# Patient Record
Sex: Female | Born: 1990
Health system: Southern US, Community
[De-identification: ages and names within clinical notes are randomized; demographics above are authoritative.]

## PROBLEM LIST (undated history)

## (undated) DIAGNOSIS — E282 Polycystic ovarian syndrome: Secondary | ICD-10-CM

## (undated) DIAGNOSIS — J45909 Unspecified asthma, uncomplicated: Secondary | ICD-10-CM

## (undated) DIAGNOSIS — N83209 Unspecified ovarian cyst, unspecified side: Secondary | ICD-10-CM

## (undated) DIAGNOSIS — N946 Dysmenorrhea, unspecified: Secondary | ICD-10-CM

## (undated) DIAGNOSIS — D696 Thrombocytopenia, unspecified: Secondary | ICD-10-CM

## (undated) HISTORY — DX: Thrombocytopenia, unspecified: D69.6

## (undated) HISTORY — DX: Polycystic ovarian syndrome: E28.2

## (undated) HISTORY — DX: Unspecified asthma, uncomplicated: J45.909

## (undated) HISTORY — DX: Dysmenorrhea, unspecified: N94.6

## (undated) HISTORY — PX: NO PAST SURGERIES: SHX2092

---

## 2012-04-01 ENCOUNTER — Encounter: Payer: Self-pay | Admitting: Family Medicine

## 2012-04-01 ENCOUNTER — Ambulatory Visit (INDEPENDENT_AMBULATORY_CARE_PROVIDER_SITE_OTHER): Payer: Medicaid Other | Admitting: Family Medicine

## 2012-04-01 VITALS — Temp 97.7°F | Ht 65.5 in | Wt 156.3 lb

## 2012-04-01 DIAGNOSIS — Z0000 Encounter for general adult medical examination without abnormal findings: Secondary | ICD-10-CM

## 2012-04-01 DIAGNOSIS — J45990 Exercise induced bronchospasm: Secondary | ICD-10-CM | POA: Insufficient documentation

## 2012-04-01 DIAGNOSIS — N6459 Other signs and symptoms in breast: Secondary | ICD-10-CM | POA: Insufficient documentation

## 2012-04-01 NOTE — Assessment & Plan Note (Signed)
Normal exam.  Discussed healthy lifestyle and to consider multivitamin.  Will bring back physical form and immunization records when she returns for two step tb test per school requirements.

## 2012-04-01 NOTE — Progress Notes (Signed)
  Subjective:    Patient ID: Kaitlyn Espinoza, female    DOB: Jan 02, 1991, 21 y.o.   MRN: [id number]  HPI Here to establish care.  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 Did not bring it today.  Requests two step TB testing per school requirements.  Has had TB test in the past,negative.  Breast cysts:  Is concerned that she may have felt cysts in one of her breasts in the past.  Not sure which breast.  Seems to come and go with menses.  Her mother and grandmother have breast cysts.    Review of Systems Patient Information Form: Screening and ROS  AUDIT-C Score: 2 Do you feel safe in relationships? yes PHQ-2:negative  Review of Symptoms  General:  Negative for nexplained weight loss, fever Skin: Negative for new or changing mole, sore that won't heal HEENT: Negative for trouble hearing, trouble seeing, ringing in ears, mouth sores, hoarseness, change in voice, dysphagia. CV:  Negative for chest pain, dyspnea, edema, palpitations Resp: Negative for cough, dyspnea, hemoptysis GI: Negative for nausea, vomiting, diarrhea, constipation, abdominal pain, melena, hematochezia. GU: Negative for dysuria, incontinence, urinary hesitance, hematuria, vaginal or penile discharge, polyuria, sexual difficulty, lumps in testicle or breasts MSK: Negative for muscle cramps or aches, joint pain or swelling Neuro: Negative for headaches, weakness, numbness, dizziness, passing out/fainting Psych: Negative for depression, anxiety, memory problems  Positive for stress, well managed with prayer.    Objective:   Physical Exam GEN: Alert & Oriented, No acute distress HEENT: [place]/AT. EOMI, PERRLA, no conjunctival injection or scleral icterus.  Bilateral tympanic membranes intact without erythema or effusion.  .  Nares without edema or rhinorrhea.  Oropharynx is without erythema or exudates.  No anterior or posterior cervical lymphadenopathy. Breasts: no masses, lumps, skin changes, or nipple changes. CV:   Regular Rate & Rhythm, no murmur Respiratory:  Normal work of breathing, CTAB Abd:  + BS, soft, no tenderness to palpation Ext: no pre-tibial edema        Assessment & Plan:

## 2012-04-01 NOTE — Patient Instructions (Addendum)
TB test- must get shot and then come back to have it read in 48-72 hours.  You must repeat TB test in 2 weeks.  Consider taking a daily multivitamin  Consider adding daily exercise to help with your health and with stress  Please schedule a nurse visit for your TB tests and for your Depo.  Bring back your physical form and I will try to fill it out with the exam we did today.

## 2012-04-01 NOTE — Assessment & Plan Note (Signed)
Reassured patient no abnormalities seen today.  Advised to return if notices changes.

## 2012-04-08 ENCOUNTER — Telehealth: Payer: Self-pay | Admitting: *Deleted

## 2012-04-08 ENCOUNTER — Ambulatory Visit (INDEPENDENT_AMBULATORY_CARE_PROVIDER_SITE_OTHER): Payer: Medicaid Other | Admitting: *Deleted

## 2012-04-08 DIAGNOSIS — Z111 Encounter for screening for respiratory tuberculosis: Secondary | ICD-10-CM

## 2012-04-08 DIAGNOSIS — Z00129 Encounter for routine child health examination without abnormal findings: Secondary | ICD-10-CM

## 2012-04-08 DIAGNOSIS — Z0110 Encounter for examination of ears and hearing without abnormal findings: Secondary | ICD-10-CM

## 2012-04-08 DIAGNOSIS — Z0100 Encounter for examination of eyes and vision without abnormal findings: Secondary | ICD-10-CM

## 2012-04-08 DIAGNOSIS — Z020 Encounter for examination for admission to educational institution: Secondary | ICD-10-CM

## 2012-04-08 NOTE — Progress Notes (Signed)
Patient brings in from for college for Dr. Earnest Bailey to fill out. Vision and hearing needed.  Performed while patient is in office.

## 2012-04-08 NOTE — Telephone Encounter (Signed)
Form for college placed in MD box for completion.

## 2012-04-09 DIAGNOSIS — Z020 Encounter for examination for admission to educational institution: Secondary | ICD-10-CM | POA: Insufficient documentation

## 2012-04-09 NOTE — Telephone Encounter (Signed)
Form filled out, roders placed for required tests (U/A, HGB)

## 2012-04-10 ENCOUNTER — Ambulatory Visit: Payer: Medicaid Other | Admitting: *Deleted

## 2012-04-10 ENCOUNTER — Other Ambulatory Visit (INDEPENDENT_AMBULATORY_CARE_PROVIDER_SITE_OTHER): Payer: Medicaid Other

## 2012-04-10 VITALS — BP 102/72

## 2012-04-10 DIAGNOSIS — IMO0001 Reserved for inherently not codable concepts without codable children: Secondary | ICD-10-CM

## 2012-04-10 DIAGNOSIS — Z020 Encounter for examination for admission to educational institution: Secondary | ICD-10-CM

## 2012-04-10 DIAGNOSIS — Z0289 Encounter for other administrative examinations: Secondary | ICD-10-CM

## 2012-04-10 DIAGNOSIS — Z0000 Encounter for general adult medical examination without abnormal findings: Secondary | ICD-10-CM

## 2012-04-10 LAB — POCT URINALYSIS DIPSTICK
Blood, UA: NEGATIVE
Glucose, UA: NEGATIVE
Spec Grav, UA: 1.02
Urobilinogen, UA: 2

## 2012-04-10 LAB — TB SKIN TEST
Induration: 0 mm
TB Skin Test: NEGATIVE

## 2012-04-10 NOTE — Progress Notes (Signed)
Urinalysis and Hgb done.Kaitlyn Espinoza, Kaitlyn Espinoza

## 2012-04-10 NOTE — Progress Notes (Signed)
Patient in office for completion of college form.  Labs obtained for urinalysis and HGB. Entered on form along with BP reading today.

## 2012-04-22 ENCOUNTER — Encounter: Payer: Self-pay | Admitting: Family Medicine

## 2012-04-22 ENCOUNTER — Ambulatory Visit (INDEPENDENT_AMBULATORY_CARE_PROVIDER_SITE_OTHER): Payer: Medicaid Other | Admitting: Family Medicine

## 2012-04-22 ENCOUNTER — Encounter: Payer: Self-pay | Admitting: *Deleted

## 2012-04-22 VITALS — BP 112/81 | HR 106 | Temp 98.6°F | Ht 65.5 in | Wt 155.0 lb

## 2012-04-22 DIAGNOSIS — R0600 Dyspnea, unspecified: Secondary | ICD-10-CM | POA: Insufficient documentation

## 2012-04-22 DIAGNOSIS — Z111 Encounter for screening for respiratory tuberculosis: Secondary | ICD-10-CM

## 2012-04-22 DIAGNOSIS — R0989 Other specified symptoms and signs involving the circulatory and respiratory systems: Secondary | ICD-10-CM

## 2012-04-22 MED ORDER — ALBUTEROL SULFATE HFA 108 (90 BASE) MCG/ACT IN AERS: 2.0000 | INHALATION_SPRAY | Freq: Four times a day (QID) | RESPIRATORY_TRACT | Status: DC | PRN

## 2012-04-22 MED ORDER — TUBERCULIN PPD 5 UNIT/0.1ML ID SOLN: 5.0000 [IU] | Freq: Once | INTRADERMAL | Status: DC

## 2012-04-22 NOTE — Assessment & Plan Note (Addendum)
?   History of exercise induced asthma as a teen.  Unclear from history if this was asthma.  No red flags today for other causes of dyspnea.  Will tx empirically with prn albuterol, discussed red flags to return for further evaluation

## 2012-04-22 NOTE — Patient Instructions (Addendum)
If you find you are having to use regularly (weekly) or you get short of breath at times not associated with heat, please let me know so we can explore this further.

## 2012-04-22 NOTE — Progress Notes (Signed)
  Subjective:    Patient ID: Kaitlyn Espinoza, female    DOB: December 23, 1990, 21 y.o.   MRN: [id number]  HPI On hot and humid days, feel fatigued and heavy-chested when breathing.  Had exercise induced asthma as a kid.  In high school, had one ER visit  For possible asthma.  No trouble with exercise in cool environments. This occurs infrequently.  I have reviewed patient's  PMH, FH, and Social history and Medications as related to this visit. Brother has asthma.  Review of Systems no fever,chills, nausea, dyspnea on exertion, cough.     Objective:   Physical Exam vitals reviewed.  Not tachycardic GEN: Alert & Oriented, No acute distress CV:  Regular Rate & Rhythm, no murmur Respiratory:  Normal work of breathing, CTAB Abd:  + BS, soft, no tenderness to palpation Ext: no pre-tibial edema         Assessment & Plan:

## 2012-04-25 ENCOUNTER — Ambulatory Visit (INDEPENDENT_AMBULATORY_CARE_PROVIDER_SITE_OTHER): Payer: Medicaid Other | Admitting: *Deleted

## 2012-04-25 DIAGNOSIS — Z111 Encounter for screening for respiratory tuberculosis: Secondary | ICD-10-CM

## 2012-04-25 DIAGNOSIS — IMO0001 Reserved for inherently not codable concepts without codable children: Secondary | ICD-10-CM

## 2012-04-25 NOTE — Progress Notes (Signed)
PPD negative 0 mm induration

## 2012-06-27 ENCOUNTER — Encounter: Payer: Self-pay | Admitting: Family Medicine

## 2012-06-27 ENCOUNTER — Ambulatory Visit (INDEPENDENT_AMBULATORY_CARE_PROVIDER_SITE_OTHER): Payer: Medicaid Other | Admitting: Family Medicine

## 2012-06-27 VITALS — BP 107/68 | HR 81 | Temp 98.2°F | Ht 65.5 in | Wt 162.0 lb

## 2012-06-27 DIAGNOSIS — M549 Dorsalgia, unspecified: Secondary | ICD-10-CM

## 2012-06-27 MED ORDER — CYCLOBENZAPRINE HCL 5 MG PO TABS: 5.0000 mg | ORAL_TABLET | Freq: Every evening | ORAL | Status: DC | PRN

## 2012-06-27 MED ORDER — NAPROXEN 500 MG PO TABS: 500.0000 mg | ORAL_TABLET | Freq: Two times a day (BID) | ORAL | Status: DC

## 2012-06-27 NOTE — Patient Instructions (Signed)
Back Exercises These exercises may help you when beginning to rehabilitate your injury. Your symptoms may resolve with or without further involvement from your physician, physical therapist or athletic trainer. While completing these exercises, remember:   Restoring tissue flexibility helps normal motion to return to the joints. This allows healthier, less painful movement and activity.   An effective stretch should be held for at least 30 seconds.   A stretch should never be painful. You should only feel a gentle lengthening or release in the stretched tissue.  STRETCH - Extension, Prone on Elbows   Lie on your stomach on the floor, a bed will be too soft. Place your palms about shoulder width apart and at the height of your head.   Place your elbows under your shoulders. If this is too painful, stack pillows under your chest.   Allow your body to relax so that your hips drop lower and make contact more completely with the floor.   Hold this position for ____30______ seconds.   Slowly return to lying flat on the floor.  Repeat ____3______ times. Complete this exercise _____2_____ times per day.  RANGE OF MOTION - Extension, Prone Press Ups   Lie on your stomach on the floor, a bed will be too soft. Place your palms about shoulder width apart and at the height of your head.   Keeping your back as relaxed as possible, slowly straighten your elbows while keeping your hips on the floor. You may adjust the placement of your hands to maximize your comfort. As you gain motion, your hands will come more underneath your shoulders.   Hold this position __________ seconds.   Slowly return to lying flat on the floor.  Repeat __________ times. Complete this exercise __________ times per day.  RANGE OF MOTION- Quadruped, Neutral Spine   Assume a hands and knees position on a firm surface. Keep your hands under your shoulders and your knees under your hips. You may place padding under your knees  for comfort.   Drop your head and point your tail bone toward the ground below you. This will round out your low back like an angry cat. Hold this position for __________ seconds.   Slowly lift your head and release your tail bone so that your back sags into a large arch, like an old horse.   Hold this position for __________ seconds.   Repeat this until you feel limber in your low back.   Now, find your "sweet spot." This will be the most comfortable position somewhere between the two previous positions. This is your neutral spine. Once you have found this position, tense your stomach muscles to support your low back.   Hold this position for __________ seconds.  Repeat __________ times. Complete this exercise __________ times per day.  STRETCH - Flexion, Single Knee to Chest   Lie on a firm bed or floor with both legs extended in front of you.   Keeping one leg in contact with the floor, bring your opposite knee to your chest. Hold your leg in place by either grabbing behind your thigh or at your knee.   Pull until you feel a gentle stretch in your low back. Hold __________ seconds.   Slowly release your grasp and repeat the exercise with the opposite side.  Repeat __________ times. Complete this exercise __________ times per day.  STRETCH - Hamstrings, Standing  Stand or sit and extend your right / left leg, placing your foot on a chair  or foot stool   Keeping a slight arch in your low back and your hips straight forward.   Lead with your chest and lean forward at the waist until you feel a gentle stretch in the back of your right / left knee or thigh. (When done correctly, this exercise requires leaning only a small distance.)   Hold this position for __________ seconds.  Repeat __________ times. Complete this stretch __________ times per day. STRENGTHENING - Deep Abdominals, Pelvic Tilt   Lie on a firm bed or floor. Keeping your legs in front of you, bend your knees so they  are both pointed toward the ceiling and your feet are flat on the floor.   Tense your lower abdominal muscles to press your low back into the floor. This motion will rotate your pelvis so that your tail bone is scooping upwards rather than pointing at your feet or into the floor.   With a gentle tension and even breathing, hold this position for __________ seconds.  Repeat __________ times. Complete this exercise __________ times per day.  STRENGTHENING - Abdominals, Crunches   Lie on a firm bed or floor. Keeping your legs in front of you, bend your knees so they are both pointed toward the ceiling and your feet are flat on the floor. Cross your arms over your chest.   Slightly tip your chin down without bending your neck.   Tense your abdominals and slowly lift your trunk high enough to just clear your shoulder blades. Lifting higher can put excessive stress on the low back and does not further strengthen your abdominal muscles.   Control your return to the starting position.  Repeat __________ times. Complete this exercise __________ times per day.  STRENGTHENING - Quadruped, Opposite UE/LE Lift   Assume a hands and knees position on a firm surface. Keep your hands under your shoulders and your knees under your hips. You may place padding under your knees for comfort.   Find your neutral spine and gently tense your abdominal muscles so that you can maintain this position. Your shoulders and hips should form a rectangle that is parallel with the floor and is not twisted.   Keeping your trunk steady, lift your right hand no higher than your shoulder and then your left leg no higher than your hip. Make sure you are not holding your breath. Hold this position __________ seconds.   Continuing to keep your abdominal muscles tense and your back steady, slowly return to your starting position. Repeat with the opposite arm and leg.  Repeat __________ times. Complete this exercise __________ times  per day. Document Released: 10/05/2005 Document Revised: 09/06/2011 Document Reviewed: 12/30/2008 [facility] Patient Information 2012 [place], [place].

## 2012-06-27 NOTE — Assessment & Plan Note (Signed)
No acute injury, no red flags on history or exam.  Discussed course of back pain.  Rx for naproxen, schedule x 1 week, then prn, and Rx flexeril qhs prn.  Gave hand out for home exercise program.  F/U if not improving in 4 weeks.

## 2012-06-27 NOTE — Progress Notes (Signed)
  Subjective:    Patient ID: Kaitlyn Espinoza, female    DOB: Apr 27, 1991, 21 y.o.   MRN: [id number]  HPI  Patient presents with low and mid back pain x 1 week. She had no injury, and has not changed her activity.  The pain is in the sides in the mid back and low back.  It has been keeping her up at night.  She says she has been taking tylenol that helps, but she was worried about taking too much.  She has not had pain shooting down her legs.  She denies any other symptoms- no fevers/chills/nausea/vomiting/dsyuria.   Review of Systems See HPI    Objective:   Physical Exam BP 107/68  Pulse 81  Temp 98.2 F (36.8 C) (Oral)  Ht 5' 5.5" (1.664 m)  Wt 162 lb (73.483 kg)  BMI 26.55 kg/m2 General appearance: alert, cooperative and no distress Back: symmetric, no curvature. ROM normal. No CVA tenderness. Mild TTP in paraspinal muscles in thoracic and lumbar spine.  LE strength, sensation, reflexes are normal.  Straight leg test negative.        Assessment & Plan:

## 2012-07-09 ENCOUNTER — Ambulatory Visit (INDEPENDENT_AMBULATORY_CARE_PROVIDER_SITE_OTHER): Payer: Self-pay | Admitting: Family Medicine

## 2012-07-09 ENCOUNTER — Encounter: Payer: Self-pay | Admitting: Family Medicine

## 2012-07-09 VITALS — BP 107/72 | HR 85 | Temp 98.3°F | Ht 65.5 in | Wt 164.0 lb

## 2012-07-09 DIAGNOSIS — J069 Acute upper respiratory infection, unspecified: Secondary | ICD-10-CM

## 2012-07-09 MED ORDER — FLUTICASONE PROPIONATE 50 MCG/ACT NA SUSP: 2.0000 | Freq: Every day | NASAL | Status: DC

## 2012-07-09 NOTE — Patient Instructions (Signed)

## 2012-07-09 NOTE — Progress Notes (Signed)
  Subjective:    Patient ID: Kaitlyn Espinoza, female    DOB: 1991/05/01, 21 y.o.   MRN: [id number]  HPI  Kaitlyn Espinoza comes in with cough and nasal congestion x 2 days.  She has had a poor appetitive, but no fevers, chills, nausea, vomiting.  She attends [employer] but does not live in dorms, no known sick contacts.  She has taken OTC cold medication one night which helped her sleep.  She stayed home from school today to rest and needs a note. Denies wheezing, or need to use albuterol.   Review of Systems See HPI    Objective:   Physical Exam BP 107/72  Pulse 85  Temp 98.3 F (36.8 C) (Oral)  Ht 5' 5.5" (1.664 m)  Wt 164 lb (74.39 kg)  BMI 26.88 kg/m2  SpO2 99% General appearance: alert, cooperative and no distress Ears: normal TM's and external ear canals both ears Nose: clear discharge, turbinates red, swollen Throat: lips, mucosa, and tongue normal; teeth and gums normal Neck: no adenopathy and supple, symmetrical, trachea midline Lungs: clear to auscultation bilaterally Heart: regular rate and rhythm, S1, S2 normal, no murmur, click, rub or gallop       Assessment & Plan:

## 2012-07-09 NOTE — Assessment & Plan Note (Signed)
No red flags on exam today, Rx flonase to help open up nasal passages, advised OTC medications.  Wrote note for school today, discussed symptomatic care.

## 2012-08-19 ENCOUNTER — Ambulatory Visit (INDEPENDENT_AMBULATORY_CARE_PROVIDER_SITE_OTHER): Payer: Medicaid Other | Admitting: *Deleted

## 2012-08-19 DIAGNOSIS — Z309 Encounter for contraceptive management, unspecified: Secondary | ICD-10-CM

## 2012-08-19 MED ORDER — MEDROXYPROGESTERONE ACETATE 150 MG/ML IM SUSP
150.0000 mg | Freq: Once | INTRAMUSCULAR | Status: AC
  Administered 2012-08-19: 150 mg via INTRAMUSCULAR

## 2012-08-19 NOTE — Progress Notes (Signed)
Patient in office for Depo injection. States she was due to receive it by Nov 1. Received last injection at [facility]. Her medicaid was not effective  until now. She has been taking birth control pills from a friend. ( the friend was switched to a different kind ) since Nov 1. Dr. Earnest Bailey consulted and she advises can restart Depo today after urine pregnancy test. Test performed and is negative. Patient reports sexual activity in past 6 days. Advised she should return in 2 weeks to repeat urine pregnancy test.. Appointment scheduled for lab visit.

## 2012-09-02 ENCOUNTER — Other Ambulatory Visit: Payer: Medicaid Other

## 2012-11-17 ENCOUNTER — Ambulatory Visit (INDEPENDENT_AMBULATORY_CARE_PROVIDER_SITE_OTHER): Payer: Medicaid Other | Admitting: *Deleted

## 2012-11-17 DIAGNOSIS — Z309 Encounter for contraceptive management, unspecified: Secondary | ICD-10-CM

## 2012-11-17 MED ORDER — MEDROXYPROGESTERONE ACETATE 150 MG/ML IM SUSP
150.0000 mg | Freq: Once | INTRAMUSCULAR | Status: AC
  Administered 2012-11-17: 150 mg via INTRAMUSCULAR

## 2012-11-17 NOTE — Progress Notes (Signed)
Patient did not return for second urine pregnancy test after last depo.  Urine pregnancy test performed today and is negative. Next depo due May 5 thru Feb 16, 2013

## 2012-12-05 ENCOUNTER — Emergency Department (HOSPITAL_COMMUNITY)
Admission: EM | Admit: 2012-12-05 | Discharge: 2012-12-05 | Disposition: A | Discharge status: Home / Self Care | Payer: Medicaid Other | Attending: Emergency Medicine | Admitting: Emergency Medicine

## 2012-12-05 ENCOUNTER — Encounter (HOSPITAL_COMMUNITY): Payer: Self-pay | Admitting: Emergency Medicine

## 2012-12-05 ENCOUNTER — Emergency Department (HOSPITAL_COMMUNITY): Payer: Medicaid Other

## 2012-12-05 ENCOUNTER — Other Ambulatory Visit: Payer: Self-pay

## 2012-12-05 DIAGNOSIS — IMO0002 Reserved for concepts with insufficient information to code with codable children: Secondary | ICD-10-CM | POA: Insufficient documentation

## 2012-12-05 DIAGNOSIS — J45909 Unspecified asthma, uncomplicated: Secondary | ICD-10-CM | POA: Insufficient documentation

## 2012-12-05 DIAGNOSIS — R079 Chest pain, unspecified: Secondary | ICD-10-CM | POA: Insufficient documentation

## 2012-12-05 DIAGNOSIS — Z79899 Other long term (current) drug therapy: Secondary | ICD-10-CM | POA: Insufficient documentation

## 2012-12-05 LAB — POCT I-STAT TROPONIN I: Troponin i, poc: 0 ng/mL (ref 0.00–0.08)

## 2012-12-05 LAB — CBC WITH DIFFERENTIAL/PLATELET
Basophils Absolute: 0 10*3/uL (ref 0.0–0.1)
Lymphocytes Relative: 37 % (ref 12–46)
Lymphs Abs: 1.9 10*3/uL (ref 0.7–4.0)
Neutro Abs: 2.7 10*3/uL (ref 1.7–7.7)
Neutrophils Relative %: 52 % (ref 43–77)
Platelets: 140 10*3/uL — ABNORMAL LOW (ref 150–400)
RBC: 4.79 MIL/uL (ref 3.87–5.11)
RDW: 13.9 % (ref 11.5–15.5)
WBC: 5.2 10*3/uL (ref 4.0–10.5)

## 2012-12-05 LAB — BASIC METABOLIC PANEL
CO2: 21 mEq/L (ref 19–32)
Calcium: 8.7 mg/dL (ref 8.4–10.5)
Chloride: 103 mEq/L (ref 96–112)
Potassium: 3.6 mEq/L (ref 3.5–5.1)
Sodium: 136 mEq/L (ref 135–145)

## 2012-12-05 LAB — D-DIMER, QUANTITATIVE: D-Dimer, Quant: 0.33 ug/mL-FEU (ref 0.00–0.48)

## 2012-12-05 MED ORDER — IBUPROFEN 800 MG PO TABS
800.0000 mg | ORAL_TABLET | Freq: Once | ORAL | Status: AC
  Administered 2012-12-05: 800 mg via ORAL
  Filled 2012-12-05: qty 1

## 2012-12-05 NOTE — ED Notes (Signed)
Pt presents with c/o severe chest pain that started around 5pm yesterday  Pt states she was sitting down when the pain started  Pt states the pain is sharp in nature and started off coming and going but has become more constant as time has passed  Denies any other sxs associated with the pain

## 2012-12-05 NOTE — ED Provider Notes (Signed)
History     CSN: [id number]  Arrival date & time 12/05/12  5784   First MD Initiated Contact with Patient 12/05/12 0359      Chief Complaint  Patient presents with  . Chest Pain    (Consider location/radiation/quality/duration/timing/severity/associated sxs/prior treatment) HPI Comments: This is a 22 year old female who is complaining of sharp chest pain since 5pm yesterday. It is constantly there, but has increased in severity many times. When the pain worsening it lasts for 5-10 minutes. The pain is in her left chest and radiates up to the left shoulder. Sometimes the pain goes to her back. No SOB, diaphoresis, orthopnea, abdominal pain, nausea, vomiting. This is the first time she has had pain like this. She has not tried anything to relieve the pain. Nothing triggers the pain. She was awoken from sleep with this pain which is what prompted her to come in. She denies any current or history of drug use. She is a non-smoker. She states she was told she had a murmur as a baby that went away. On a middle school sports physical an extra beat was noted. She states she never heard anything about it in high school. She is now in nursing school.   Patient is a 22 y.o. female presenting with chest pain. The history is provided by the patient. No language interpreter was used.  Chest Pain Pain location:  L chest Pain quality: sharp   Pain radiates to:  L shoulder Pain radiates to the back: yes   Pain severity:  Moderate Onset quality:  Sudden Duration:  11 hours Timing:  Intermittent Progression:  Worsening Chronicity:  New Context: at rest   Relieved by:  Nothing Worsened by:  Nothing tried Ineffective treatments:  None tried Associated symptoms: no abdominal pain, no dizziness, no fever, no lower extremity edema, no nausea, no numbness, no orthopnea, no shortness of breath, not vomiting and no weakness     Past Medical History  Diagnosis Date  . Asthma     History reviewed. No  pertinent past surgical history.  Family History  Problem Relation Age of Onset  . Alcohol abuse Father   . Hyperlipidemia Father   . Diabetes Paternal Grandmother     History  Substance Use Topics  . Smoking status: Never Smoker   . Smokeless tobacco: Not on file  . Alcohol Use: No    OB History   Grav Para Term Preterm Abortions TAB SAB Ect Mult Living                  Review of Systems  Constitutional: Negative for fever.  Respiratory: Negative for shortness of breath.   Cardiovascular: Positive for chest pain. Negative for orthopnea.  Gastrointestinal: Negative for nausea, vomiting and abdominal pain.  Neurological: Negative for dizziness, weakness and numbness.  All other systems reviewed and are negative.    Allergies  Review of patient's allergies indicates no known allergies.  Home Medications   Current Outpatient Rx  Name  Route  Sig  Dispense  Refill  . albuterol (PROVENTIL HFA) 108 (90 BASE) MCG/ACT inhaler   Inhalation   Inhale 2 puffs into the lungs every 6 (six) hours as needed for wheezing.   1 Inhaler   0   . cyclobenzaprine (FLEXERIL) 5 MG tablet   Oral   Take 1 tablet (5 mg total) by mouth at bedtime as needed for muscle spasms.   30 tablet   0   . fluticasone (FLONASE) 50  MCG/ACT nasal spray   Nasal   Place 2 sprays into the nose daily.   16 g   6   . medroxyPROGESTERone (DEPO-PROVERA) 150 MG/ML injection   Intramuscular   Inject 150 mg into the muscle every 3 (three) months.         . naproxen (NAPROSYN) 500 MG tablet   Oral   Take 1 tablet (500 mg total) by mouth 2 (two) times daily with a meal.   30 tablet   0     BP 130/73  Pulse 89  Temp(Src) 99 F (37.2 C) (Oral)  Resp 22  SpO2 100%  Physical Exam  Nursing note and vitals reviewed. Constitutional: She is oriented to person, place, and time. She appears well-developed and well-nourished. No distress.  HENT:  Head: Normocephalic and atraumatic.  Right Ear:  External ear normal.  Left Ear: External ear normal.  Eyes: Conjunctivae, EOM and lids are normal.  Neck: Trachea normal, normal range of motion and phonation normal. No tracheal deviation present.  Cardiovascular: Normal rate, regular rhythm, S1 normal, S2 normal, normal heart sounds, intact distal pulses and normal pulses.  Exam reveals no gallop and no friction rub.   No murmur heard. Pulmonary/Chest: Effort normal and breath sounds normal. No stridor. No respiratory distress. She has no wheezes. She has no rales. She exhibits tenderness (left chest).  Abdominal: Soft. Normal appearance and bowel sounds are normal. She exhibits no distension. There is no tenderness.  Musculoskeletal: Normal range of motion.  Neurological: She is alert and oriented to person, place, and time.  Skin: Skin is warm and dry. No rash noted. She is not diaphoretic. No erythema.  Psychiatric: She has a normal mood and affect. Her speech is normal and behavior is normal. Cognition and memory are normal.    ED Course  Procedures (including critical care time)  Labs Reviewed  CBC WITH DIFFERENTIAL - Abnormal; Notable for the following:    Platelets 140 (*)    All other components within normal limits  D-DIMER, QUANTITATIVE  BASIC METABOLIC PANEL  POCT I-STAT TROPONIN I   Dg Chest 2 View  12/05/2012  *RADIOLOGY REPORT*  Clinical Data: Mid to left-sided chest pain; shortness of breath. History of smoking.  CHEST - 2 VIEW  Comparison: None.  Findings: The lungs are well-aerated and clear.  There is no evidence of focal opacification, pleural effusion or pneumothorax.  The heart is normal in size; the mediastinal contour is within normal limits.  No acute osseous abnormalities are seen.  IMPRESSION: No acute cardiopulmonary process seen.   Original Report Authenticated By: Tonia Ghent, M.D.     Date: 12/05/2012 03:50  Rate: 78  Rhythm: normal sinus rhythm  QRS Axis: normal  Intervals: normal  ST/T Wave  abnormalities: nonspecific ST changes  Conduction Disutrbances:none  Narrative Interpretation:   Old EKG Reviewed: none available    Date: 12/05/2012 04:16  Rate: 77  Rhythm: normal sinus rhythm  QRS Axis: normal  Intervals: normal  ST/T Wave abnormalities: nonspecific ST changes  Conduction Disutrbances:none  Narrative Interpretation:   Old EKG Reviewed: none available  No diagnosis found.    MDM  Patient presents today for 11 hours of chest pain. The pain is sharp and intermittent, lasting about 5-10 minutes when it occurs with a baseline of mild pain. No associated symptoms. Denies any history of drug use. CXR negative for acute disease. D-Dimer neg, troponin neg, BMP WNL. Low suspicion for ACS, PE, myocarditis. Patient remained stable during  course of ED stay. Discussed case with cardiology. ECG showed non-specific ST changes. Dr. Sharyn Lull unimpressed. Follow up with PCP. Return instructions given. Patient / Family / Caregiver informed of clinical course, understand medical decision-making process, and agree with plan.       Mora Bellman, PA-C 12/05/12 1610  Mora Bellman, PA-C 12/22/12 [phone]

## 2012-12-19 NOTE — ED Provider Notes (Signed)
Medical screening examination/treatment/procedure(s) were performed by non-physician practitioner and as supervising physician I was immediately available for consultation/collaboration.  Jones Skene, M.D.      Jones Skene, MD 12/19/12 [phone]

## 2012-12-22 NOTE — ED Provider Notes (Signed)
Medical screening examination/treatment/procedure(s) were performed by non-physician practitioner and as supervising physician I was immediately available for consultation/collaboration.  Jones Skene, M.D.     Jones Skene, MD 12/22/12 [phone]

## 2013-02-11 ENCOUNTER — Ambulatory Visit: Payer: Medicaid Other | Admitting: *Deleted

## 2013-02-11 DIAGNOSIS — Z309 Encounter for contraceptive management, unspecified: Secondary | ICD-10-CM

## 2013-02-11 MED ORDER — MEDROXYPROGESTERONE ACETATE 150 MG/ML IM SUSP
150.0000 mg | Freq: Once | INTRAMUSCULAR | Status: AC
  Administered 2013-02-11: 150 mg via INTRAMUSCULAR

## 2013-02-11 NOTE — Progress Notes (Signed)
Pt here for depo. Depo given right deltoid per pt request. Next depo due Jul 20 -aug 13. Wyatt Haste, RN-BSN

## 2013-09-01 ENCOUNTER — Ambulatory Visit (INDEPENDENT_AMBULATORY_CARE_PROVIDER_SITE_OTHER): Payer: Medicaid Other | Admitting: Family Medicine

## 2013-09-01 ENCOUNTER — Other Ambulatory Visit (HOSPITAL_COMMUNITY)
Admission: RE | Admit: 2013-09-01 | Discharge: 2013-09-01 | Disposition: A | Discharge status: Home / Self Care | Payer: Medicaid Other | Source: Ambulatory Visit | Attending: Family Medicine | Admitting: Family Medicine

## 2013-09-01 ENCOUNTER — Encounter: Payer: Self-pay | Admitting: Family Medicine

## 2013-09-01 VITALS — BP 108/66 | HR 77 | Temp 97.7°F | Ht 65.5 in | Wt 174.0 lb

## 2013-09-01 DIAGNOSIS — Z0000 Encounter for general adult medical examination without abnormal findings: Secondary | ICD-10-CM

## 2013-09-01 DIAGNOSIS — Z309 Encounter for contraceptive management, unspecified: Secondary | ICD-10-CM | POA: Insufficient documentation

## 2013-09-01 DIAGNOSIS — Z01419 Encounter for gynecological examination (general) (routine) without abnormal findings: Secondary | ICD-10-CM | POA: Insufficient documentation

## 2013-09-01 DIAGNOSIS — Z20828 Contact with and (suspected) exposure to other viral communicable diseases: Secondary | ICD-10-CM

## 2013-09-01 DIAGNOSIS — Z124 Encounter for screening for malignant neoplasm of cervix: Secondary | ICD-10-CM

## 2013-09-01 DIAGNOSIS — IMO0001 Reserved for inherently not codable concepts without codable children: Secondary | ICD-10-CM

## 2013-09-01 DIAGNOSIS — Z202 Contact with and (suspected) exposure to infections with a predominantly sexual mode of transmission: Secondary | ICD-10-CM

## 2013-09-01 DIAGNOSIS — Z113 Encounter for screening for infections with a predominantly sexual mode of transmission: Secondary | ICD-10-CM | POA: Insufficient documentation

## 2013-09-01 LAB — POCT URINE PREGNANCY: Preg Test, Ur: NEGATIVE

## 2013-09-01 MED ORDER — MEDROXYPROGESTERONE ACETATE 150 MG/ML IM SUSP
150.0000 mg | Freq: Once | INTRAMUSCULAR | Status: AC
  Administered 2013-09-01: 150 mg via INTRAMUSCULAR

## 2013-09-01 NOTE — Patient Instructions (Signed)
It was nice to meet you today.  If your insurance changes, please call the clinic to get the HPV shot. Unfortunately Medicaid does not cover the HPV vaccine for patients older than 18.  Depo shot: as you've had before, this is effective for about 90 days. Because of your previous unprotected intercourse, you should come back in 2 weeks for a repeat urine pregnancy test. If you have any questions about other forms of birth control please do not hesitate to call the clinic and schedule another appointment.   If the results of your tests are negative, you will get a letter in the mail. If they are abnormal, someone from clinic will call you and if needed schedule an appointment to be seen.

## 2013-09-01 NOTE — Assessment & Plan Note (Signed)
Pap smear, GC/chlamydia, HIV, RPR testing today. Would like HPV vaccine but Medicaid does not cover over the age of 10 (she thinks she is getting better insurance in several months).

## 2013-09-01 NOTE — Progress Notes (Signed)
   Subjective:    Patient ID: Kaitlyn Espinoza, female    DOB: November 20, 1990, 22 y.o.   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 Has not had HPV vaccine. - Recent blood work back in March normal.  # Contraceptive management - wants to restart depo shot for birth control today. Discussed other options and she wants to get depo today with plan to do more research and possibly change to IUD or nexplanon in the next few months - sexually active with 1 partner for past 5 years, uses condoms intermittently. Had unprotected sex about 2 weeks ago.  Review of Systems Per HPI    Objective:   Physical Exam BP 108/66  Pulse 77  Temp(Src) 97.7 F (36.5 C) (Oral)  Ht 5' 5.5" (1.664 m)  Wt 174 lb (78.926 kg)  BMI 28.50 kg/m2  LMP 08/11/2013  General: NAD HEENT: PERRL, EOMI CV: RRR, normal heart sounds Resp: CTAB, effort normal GU: NAVM, normal appearing vaginal discharge. Ovaries normal in size, no tenderness.    Assessment & Plan:  See Problem List documentation

## 2013-09-01 NOTE — Assessment & Plan Note (Signed)
Would like to restart depo today (Upreg negative today, scheduled visit for 2 weeks from now for repeat pregnancy test). Using condoms intermittently. Discussed other options including IUD and nexplanon, she would like to do more research and possibly change before next depo shot is due.

## 2013-09-02 LAB — HIV ANTIBODY (ROUTINE TESTING W REFLEX): HIV: NONREACTIVE

## 2013-09-02 LAB — RPR

## 2013-09-04 ENCOUNTER — Encounter: Payer: Self-pay | Admitting: Family Medicine

## 2013-09-16 ENCOUNTER — Other Ambulatory Visit: Payer: Medicaid Other

## 2013-12-08 ENCOUNTER — Ambulatory Visit (INDEPENDENT_AMBULATORY_CARE_PROVIDER_SITE_OTHER): Payer: Medicaid Other | Admitting: *Deleted

## 2013-12-08 ENCOUNTER — Encounter: Payer: Self-pay | Admitting: *Deleted

## 2013-12-08 DIAGNOSIS — Z309 Encounter for contraceptive management, unspecified: Secondary | ICD-10-CM

## 2013-12-08 LAB — POCT URINE PREGNANCY: PREG TEST UR: NEGATIVE

## 2013-12-08 MED ORDER — MEDROXYPROGESTERONE ACETATE 150 MG/ML IM SUSP
150.0000 mg | Freq: Once | INTRAMUSCULAR | Status: AC
  Administered 2013-12-08: 150 mg via INTRAMUSCULAR

## 2013-12-08 NOTE — Progress Notes (Signed)
   Pt late for Depo Provera injection. Pregnancy test ordered, results negative.  Pt advised to retake pregnancy in 2 weeks and use condoms for the 7 days.  Pt tolerated Depo injection. Depo given Left upper outer quadrant.  Next injection due May 26 -March 09, 2014.  Reminder card given. Clovis PuMartin, Drayson Dorko L, RN

## 2014-02-18 ENCOUNTER — Ambulatory Visit (INDEPENDENT_AMBULATORY_CARE_PROVIDER_SITE_OTHER): Payer: Medicaid Other | Admitting: Family Medicine

## 2014-02-18 ENCOUNTER — Encounter: Payer: Self-pay | Admitting: Family Medicine

## 2014-02-18 VITALS — BP 103/71 | HR 89 | Ht 65.5 in | Wt 169.0 lb

## 2014-02-18 DIAGNOSIS — Z309 Encounter for contraceptive management, unspecified: Secondary | ICD-10-CM

## 2014-02-18 DIAGNOSIS — IMO0001 Reserved for inherently not codable concepts without codable children: Secondary | ICD-10-CM

## 2014-02-18 LAB — POCT URINE PREGNANCY: PREG TEST UR: NEGATIVE

## 2014-02-18 MED ORDER — NORETHIN ACE-ETH ESTRAD-FE 1-20 MG-MCG(24) PO TABS: 1.0000 | ORAL_TABLET | Freq: Every day | ORAL | Status: DC

## 2014-02-18 NOTE — Patient Instructions (Signed)
Ethinyl Estradiol; Norethindrone Acetate tablets (contraception) What is this medicine? ETHINYL ESTRADIOL; NORETHINDRONE ACETATE (ETH in il es tra DYE ole; nor eth IN drone AS e tate) is an oral contraceptive. The products combine two types of female hormones, an estrogen and a progestin. They are used to prevent ovulation and pregnancy. This medicine may be used for other purposes; ask your health care provider or pharmacist if you have questions. COMMON BRAND NAME(S): Estrostep Fe, Gildess Fe 1.5/30, Gildess Fe 1/20, Gildess, Junel 1.5/30, Junel 1/20, Junel Fe 1.5/30, Junel Fe 1/20, Larin Fe, Granite, Lo Loestrin Fe, Loestrin 1.5/30, Loestrin 1/20, Loestrin 24 Fe, Loestrin FE 1.5/30, Loestrin FE 1/20, Lomedia 24 Fe, Microgestin 1.5/30, Microgestin 1/20, Microgestin Fe 1.5/30, Microgestin Fe 1/20, Tilia Fe, Tri-Legest Fe What should I tell my health care provider before I take this medicine? They need to know if you have or ever had any of these conditions: -abnormal vaginal bleeding -blood vessel disease or blood clots -breast, cervical, endometrial, ovarian, liver, or uterine cancer -diabetes -gallbladder disease -heart disease or recent heart attack -high blood pressure -high cholesterol -kidney disease -liver disease -migraine headaches -stroke -systemic lupus erythematosus (SLE) -tobacco smoker -an unusual or allergic reaction to estrogens, progestins, other medicines, foods, dyes, or preservatives -pregnant or trying to get pregnant -breast-feeding How should I use this medicine? Take this medicine by mouth. To reduce nausea, this medicine may be taken with food. Follow the directions on the prescription label. Take this medicine at the same time each day and in the order directed on the package. Do not take your medicine more often than directed. Contact your pediatrician regarding the use of this medicine in children. Special care may be needed. This medicine has been used in female  children who have started having menstrual periods. A patient package insert for the product will be given with each prescription and refill. Read this sheet carefully each time. The sheet may change frequently. Overdosage: If you think you have taken too much of this medicine contact a poison control center or emergency room at once. NOTE: This medicine is only for you. Do not share this medicine with others. What if I miss a dose? If you miss a dose, refer to the patient information sheet you received with your medicine for direction. If you miss more than one pill, this medicine may not be as effective and you may need to use another form of birth control. What may interact with this medicine? -acetaminophen -antibiotics or medicines for infections, especially rifampin, rifabutin, rifapentine, and griseofulvin, and possibly penicillins or tetracyclines -aprepitant -ascorbic acid (vitamin C) -atorvastatin -barbiturate medicines, such as phenobarbital -bosentan -carbamazepine -caffeine -clofibrate -cyclosporine -dantrolene -doxercalciferol -felbamate -grapefruit juice -hydrocortisone -medicines for anxiety or sleeping problems, such as diazepam or temazepam -medicines for diabetes, including pioglitazone -mineral oil -modafinil -mycophenolate -nefazodone -oxcarbazepine -phenytoin -prednisolone -ritonavir or other medicines for HIV infection or AIDS -rosuvastatin -selegiline -soy isoflavones supplements -St. John's wort -tamoxifen or raloxifene -theophylline -thyroid hormones -topiramate -warfarin This list may not describe all possible interactions. Give your health care provider a list of all the medicines, herbs, non-prescription drugs, or dietary supplements you use. Also tell them if you smoke, drink alcohol, or use illegal drugs. Some items may interact with your medicine. What should I watch for while using this medicine? Visit your doctor or health care  professional for regular checks on your progress. You will need a regular breast and pelvic exam and Pap smear while on this medicine. Use an additional  method of contraception during the first cycle that you take these tablets. If you have any reason to think you are pregnant, stop taking this medicine right away and contact your doctor or health care professional. If you are taking this medicine for hormone related problems, it may take several cycles of use to see improvement in your condition. Smoking increases the risk of getting a blood clot or having a stroke while you are taking birth control pills, especially if you are more than 22 years old. You are strongly advised not to smoke. This medicine can make your body retain fluid, making your fingers, hands, or ankles swell. Your blood pressure can go up. Contact your doctor or health care professional if you feel you are retaining fluid. This medicine can make you more sensitive to the sun. Keep out of the sun. If you cannot avoid being in the sun, wear protective clothing and use sunscreen. Do not use sun lamps or tanning beds/booths. If you wear contact lenses and notice visual changes, or if the lenses begin to feel uncomfortable, consult your eye care specialist. In some women, tenderness, swelling, or minor bleeding of the gums may occur. Notify your dentist if this happens. Brushing and flossing your teeth regularly may help limit this. See your dentist regularly and inform your dentist of the medicines you are taking. If you are going to have elective surgery, you may need to stop taking this medicine before the surgery. Consult your health care professional for advice. This medicine does not protect you against HIV infection (AIDS) or any other sexually transmitted diseases. What side effects may I notice from receiving this medicine? Side effects that you should report to your doctor or health care professional as soon as  possible: -breast tissue changes or discharge -changes in vaginal bleeding during your period or between your periods -chest pain -coughing up blood -dizziness or fainting spells -headaches or migraines -leg, arm or groin pain -severe or sudden headaches -stomach pain (severe) -sudden shortness of breath -sudden loss of coordination, especially on one side of the body -speech problems -symptoms of vaginal infection like itching, irritation or unusual discharge -tenderness in the upper abdomen -vomiting -weakness or numbness in the arms or legs, especially on one side of the body -yellowing of the eyes or skin Side effects that usually do not require medical attention (report to your doctor or health care professional if they continue or are bothersome): -breakthrough bleeding and spotting that continues beyond the 3 initial cycles of pills -breast tenderness -mood changes, anxiety, depression, frustration, anger, or emotional outbursts -increased sensitivity to sun or ultraviolet light -nausea -skin rash, acne, or brown spots on the skin -weight gain (slight) This list may not describe all possible side effects. Call your doctor for medical advice about side effects. You may report side effects to FDA at 1-800-FDA-1088. Where should I keep my medicine? Keep out of the reach of children. Store at room temperature between 15 and 30 degrees C (59 and 86 degrees F). Throw away any unused medicine after the expiration date. NOTE: This sheet is a summary. It may not cover all possible information. If you have questions about this medicine, talk to your doctor, pharmacist, or health care provider.  2014, [employer]vier/Gold Standard. (2013-01-23 15:35:20)

## 2014-02-18 NOTE — Progress Notes (Signed)
Patient ID: Bubba CampDyta Espinoza, female   DOB: 02-07-1991, 23 y.o.   MRN: [id number] Subjective:  Depo x 5 yrs  Kaitlyn Espinoza is a 23 y.o. female who presents for contraception counseling. The patient has no complaints today. The patient is sexually active. Pertinent past medical history: none.  Menstrual History: OB History   Grav Para Term Preterm Abortions TAB SAB Ect Mult Living                 G0P0 Menarche age: [age]. LMP: Normal about 5 yrs ago with intermittent spotting associated with clots,last was about 1 wks ago and it lasted for 2 days. Cramping associated with passage of clot. Patient's last menstrual period was 02/08/2014.    The following portions of the patient's history were reviewed and updated as appropriate: allergies, current medications, past family history, past medical history, past social history, past surgical history and problem list.  Review of Systems Pertinent items are noted in HPI.   Objective:    BP 103/71  Pulse 89  Ht 5' 5.5" (1.664 m)  Wt 169 lb (76.658 kg)  BMI 27.69 kg/m2  LMP 02/08/2014 General appearance: alert Lungs: clear to auscultation bilaterally Heart: regular rate and rhythm, S1, S2 normal, no murmur, click, rub or gallop Abdomen: soft, non-tender; bowel sounds normal; no masses,  no organomegaly   Assessment:    23 y.o., discontinuing Depo-Provera injections, no contraindications.   Plan:    Recently had normal PAP. Pregnancy test done due to spotting which was negative. COunseling done on various type of birth control and she opted for OCP. S/E of medication discussed with patient and handout was also given.

## 2014-03-21 ENCOUNTER — Emergency Department (HOSPITAL_COMMUNITY)
Admission: EM | Admit: 2014-03-21 | Discharge: 2014-03-21 | Disposition: A | Discharge status: Home / Self Care | Payer: Medicaid Other | Attending: Emergency Medicine | Admitting: Emergency Medicine

## 2014-03-21 ENCOUNTER — Encounter (HOSPITAL_COMMUNITY): Payer: Self-pay | Admitting: Emergency Medicine

## 2014-03-21 DIAGNOSIS — J45909 Unspecified asthma, uncomplicated: Secondary | ICD-10-CM | POA: Insufficient documentation

## 2014-03-21 DIAGNOSIS — X500XXA Overexertion from strenuous movement or load, initial encounter: Secondary | ICD-10-CM | POA: Insufficient documentation

## 2014-03-21 DIAGNOSIS — Z79899 Other long term (current) drug therapy: Secondary | ICD-10-CM | POA: Insufficient documentation

## 2014-03-21 DIAGNOSIS — Y929 Unspecified place or not applicable: Secondary | ICD-10-CM | POA: Insufficient documentation

## 2014-03-21 DIAGNOSIS — S46812A Strain of other muscles, fascia and tendons at shoulder and upper arm level, left arm, initial encounter: Secondary | ICD-10-CM

## 2014-03-21 DIAGNOSIS — S43499A Other sprain of unspecified shoulder joint, initial encounter: Secondary | ICD-10-CM | POA: Insufficient documentation

## 2014-03-21 DIAGNOSIS — Y9389 Activity, other specified: Secondary | ICD-10-CM | POA: Insufficient documentation

## 2014-03-21 DIAGNOSIS — S46819A Strain of other muscles, fascia and tendons at shoulder and upper arm level, unspecified arm, initial encounter: Principal | ICD-10-CM

## 2014-03-21 MED ORDER — IBUPROFEN 400 MG PO TABS
800.0000 mg | ORAL_TABLET | Freq: Once | ORAL | Status: AC
  Administered 2014-03-21: 800 mg via ORAL
  Filled 2014-03-21: qty 2

## 2014-03-21 MED ORDER — IBUPROFEN 600 MG PO TABS: 600.0000 mg | ORAL_TABLET | Freq: Four times a day (QID) | ORAL | Status: DC | PRN

## 2014-03-21 MED ORDER — CYCLOBENZAPRINE HCL 10 MG PO TABS: 10.0000 mg | ORAL_TABLET | Freq: Two times a day (BID) | ORAL | Status: DC | PRN

## 2014-03-21 NOTE — ED Provider Notes (Signed)
CSN: [id number]     Arrival date & time 03/21/14  1259 History  This chart was scribed for Kaitlyn Peliffany Greene, PA-C, working with Merrie RoofJohn David Wofford III, MD, by Ardelia Memsylan Malpass ED Scribe. This patient was seen in room TR08C/TR08C and the patient's care was started at 1:45 PM.   Chief Complaint  Patient presents with  . Neck Injury    The history is provided by the patient. No language interpreter was used.    HPI Comments: Kaitlyn Espinoza is a 23 y.o. female who presents to the Emergency Department complaining of a neck injury that occurred about 5 hours ago when she suddenly turned her head to look behind her. She is complaining of constant, moderate left sided neck pain onset after the injury. She states that her pain is worsened with turning her head and with palpation. She denies any prior history of significant neck pain or injury. She denies numbness, weakness, back pain or any other pain or symptoms.    Past Medical History  Diagnosis Date  . Asthma    History reviewed. No pertinent past surgical history. Family History  Problem Relation Age of Onset  . Alcohol abuse Father   . Hyperlipidemia Father   . Diabetes Paternal Grandmother    History  Substance Use Topics  . Smoking status: Never Smoker   . Smokeless tobacco: Not on file  . Alcohol Use: No   OB History   Grav Para Term Preterm Abortions TAB SAB Ect Mult Living                 Review of Systems  Musculoskeletal: Positive for neck pain. Negative for back pain.  Neurological: Negative for weakness and numbness.  All other systems reviewed and are negative.   Allergies  Review of patient's allergies indicates no known allergies.  Home Medications   Prior to Admission medications   Medication Sig Start Date End Date Taking? Authorizing Provider  albuterol (PROVENTIL HFA) 108 (90 BASE) MCG/ACT inhaler Inhale 2 puffs into the lungs every 6 (six) hours as needed for wheezing. 04/22/12 04/22/13  Macy MisKim K Briscoe, MD   cyclobenzaprine (FLEXERIL) 10 MG tablet Take 1 tablet (10 mg total) by mouth 2 (two) times daily as needed for muscle spasms. 03/21/14   Tiffany Irine SealG Greene, PA-C  ibuprofen (ADVIL,MOTRIN) 600 MG tablet Take 1 tablet (600 mg total) by mouth every 6 (six) hours as needed. 03/21/14   Tiffany Irine SealG Greene, PA-C  medroxyPROGESTERone (DEPO-PROVERA) 150 MG/ML injection Inject 150 mg into the muscle every 3 (three) months.    Historical Provider, MD  Norethindrone Acetate-Ethinyl Estrad-FE (LOESTRIN 24 FE) 1-20 MG-MCG(24) tablet Take 1 tablet by mouth daily. 02/18/14   Janit PaganKehinde Eniola, MD   Triage Vitals: BP 120/69  Pulse 81  Temp(Src) 98.4 F (36.9 C) (Oral)  Resp 18  SpO2 100%  LMP 03/21/2014  Physical Exam  Nursing note and vitals reviewed. Constitutional: She is oriented to person, place, and time. She appears well-developed and well-nourished. No distress.  HENT:  Head: Normocephalic and atraumatic.  Eyes: Conjunctivae and EOM are normal.  Neck: Neck supple. Muscular tenderness present. No spinous process tenderness present. Decreased range of motion (due to muscle tightness) present. No tracheal deviation present.    Pt has equal strength to bilateral upper extremities.  Neurosensory function adequate to both arms Skin color is normal. Skin is warm and moist.  I see no step off deformity, no midline bony tenderness.  Pt is able to ambulate.  No  crepitus, laceration, effusion, induration, lesions, swelling.       Cardiovascular: Normal rate.   Pulmonary/Chest: Effort normal. No respiratory distress.  Neurological: She is alert and oriented to person, place, and time.  Skin: Skin is warm and dry.  Psychiatric: She has a normal mood and affect. Her behavior is normal.    ED Course  Procedures (including critical care time)  DIAGNOSTIC STUDIES: Oxygen Saturation is 100% on RA, normal by my interpretation.    COORDINATION OF CARE: 1:51 PM- Motrin has been ordered. Pt advised of plan for  treatment and pt agrees. Rx muscle relaxers and antiinflammatory medications. Patient education given on RICE  Labs Review Labs Reviewed - No data to display  Imaging Review No results found.   EKG Interpretation None      MDM   Final diagnoses:  Trapezius strain, left, initial encounter    22 y.o.Kaitlyn Espinoza's evaluation in the Emergency Department is complete. It has been determined that no acute conditions requiring further emergency intervention are present at this time. The patient/guardian have been advised of the diagnosis and plan. We have discussed signs and symptoms that warrant return to the ED, such as changes or worsening in symptoms.  Vital signs are stable at discharge. Filed Vitals:   03/21/14 1304  BP: 120/69  Pulse: 81  Temp: 98.4 F (36.9 C)  Resp: 18    Patient/guardian has voiced understanding and agreed to follow-up with the PCP or specialist.  I personally performed the services described in this documentation, which was scribed in my presence. The recorded information has been reviewed and is accurate.    Dorthula Matasiffany G Greene, PA-C 03/21/14 1358

## 2014-03-21 NOTE — ED Notes (Signed)
Pt reports at 0900 this morning she looked behind her really quickly and felt like she "tweaked" her neck. Pt reports 9/10 pain to left side of the neck when looking left. Denies blurred vision, double vision or HA. NAD.

## 2014-03-21 NOTE — Discharge Instructions (Signed)

## 2014-03-22 NOTE — ED Provider Notes (Signed)
Medical screening examination/treatment/procedure(s) were performed by non-physician practitioner and as supervising physician I was immediately available for consultation/collaboration.   EKG Interpretation None        Candyce ChurnJohn David Aaralyn Kil III, MD 03/22/14 [phone]

## 2014-09-02 ENCOUNTER — Other Ambulatory Visit: Payer: Self-pay | Admitting: Family Medicine

## 2014-09-02 MED ORDER — NORETHIN ACE-ETH ESTRAD-FE 1-20 MG-MCG(24) PO TABS: 1.0000 | ORAL_TABLET | Freq: Every day | ORAL | Status: DC

## 2014-09-02 NOTE — Telephone Encounter (Signed)
Pt called and needs a refill on her BC pills called in. jw

## 2014-10-05 ENCOUNTER — Ambulatory Visit (INDEPENDENT_AMBULATORY_CARE_PROVIDER_SITE_OTHER): Payer: Self-pay | Admitting: Family Medicine

## 2014-10-05 ENCOUNTER — Encounter: Payer: Self-pay | Admitting: Family Medicine

## 2014-10-05 VITALS — BP 111/71 | HR 70 | Temp 97.4°F | Ht 65.5 in | Wt 166.0 lb

## 2014-10-05 DIAGNOSIS — N926 Irregular menstruation, unspecified: Secondary | ICD-10-CM

## 2014-10-05 DIAGNOSIS — Z3200 Encounter for pregnancy test, result unknown: Secondary | ICD-10-CM

## 2014-10-05 LAB — POCT URINE PREGNANCY: Preg Test, Ur: NEGATIVE

## 2014-10-05 NOTE — Assessment & Plan Note (Signed)
She has only missed her period for couple of days. Urine pregnancy test done today was negative. Advised to return in 2 wks for repeat pregnancy test. Home pregnancy test also recommended. Advised she will need to stop birth control if pregnancy test is positive. She verbalized understanding.

## 2014-10-05 NOTE — Progress Notes (Signed)
Subjective:     Patient ID: Kaitlyn Espinoza, female   DOB: June 09, 1991, 24 y.o.   MRN: [id number]  HPI Missed period: LMP was 1 month ago, she expected her period few days ago, she is worried she might be pregnant. She did not take home pregnancy test. She is currently on Loestrin FE, although she takes it daily but she forget to take med the same time of the day.  Current Outpatient Prescriptions on File Prior to Visit  Medication Sig Dispense Refill  . Norethindrone Acetate-Ethinyl Estrad-FE (LOESTRIN 24 FE) 1-20 MG-MCG(24) tablet Take 1 tablet by mouth daily. 1 Package 6  . albuterol (PROVENTIL HFA) 108 (90 BASE) MCG/ACT inhaler Inhale 2 puffs into the lungs every 6 (six) hours as needed for wheezing. 1 Inhaler 0   Current Facility-Administered Medications on File Prior to Visit  Medication Dose Route Frequency Provider Last Rate Last Dose  . tuberculin injection 5 Units  5 Units Intradermal Once Macy MisKim K Briscoe, MD       Past Medical History  Diagnosis Date  . Asthma      Review of Systems  Respiratory: Negative.   Cardiovascular: Negative.   Gastrointestinal: Negative.   Genitourinary: Positive for menstrual problem.  All other systems reviewed and are negative.      Filed Vitals:   10/05/14 1207  BP: 111/71  Pulse: 70  Temp: 97.4 F (36.3 C)  TempSrc: Oral  Height: 5' 5.5" (1.664 m)  Weight: 166 lb (75.297 kg)    Objective:   Physical Exam  Constitutional: She appears well-developed. No distress.  Cardiovascular: Normal rate, regular rhythm and normal heart sounds.   No murmur heard. Pulmonary/Chest: Effort normal and breath sounds normal. No respiratory distress. She has no wheezes.  Abdominal: Soft. Bowel sounds are normal. She exhibits no distension and no mass. There is no tenderness.  Musculoskeletal: Normal range of motion. She exhibits no edema.  Nursing note and vitals reviewed.      Assessment:     Missed period     Plan:     Check problem list.

## 2014-10-05 NOTE — Patient Instructions (Signed)
Your urine pregnancy test is negative today, however if you continue to miss your period, come back for a repeat test in about 2-4 wks. Please try to take your birth control pills at the same time daily. Otherwise consider long term birth control.

## 2014-11-16 ENCOUNTER — Ambulatory Visit: Payer: Medicaid Other | Admitting: Family Medicine

## 2015-04-08 ENCOUNTER — Ambulatory Visit (INDEPENDENT_AMBULATORY_CARE_PROVIDER_SITE_OTHER): Payer: Medicaid Other | Admitting: Family Medicine

## 2015-04-08 ENCOUNTER — Encounter: Payer: Self-pay | Admitting: Family Medicine

## 2015-04-08 VITALS — BP 103/58 | HR 85 | Temp 98.8°F | Wt 158.0 lb

## 2015-04-08 DIAGNOSIS — N920 Excessive and frequent menstruation with regular cycle: Secondary | ICD-10-CM | POA: Insufficient documentation

## 2015-04-08 DIAGNOSIS — N921 Excessive and frequent menstruation with irregular cycle: Secondary | ICD-10-CM

## 2015-04-08 LAB — CBC WITH DIFFERENTIAL/PLATELET
BASOS ABS: 0 10*3/uL (ref 0.0–0.1)
BASOS PCT: 0 % (ref 0–1)
EOS PCT: 1 % (ref 0–5)
Eosinophils Absolute: 0 10*3/uL (ref 0.0–0.7)
HEMATOCRIT: 38.8 % (ref 36.0–46.0)
HEMOGLOBIN: 12.6 g/dL (ref 12.0–15.0)
LYMPHS ABS: 2 10*3/uL (ref 0.7–4.0)
LYMPHS PCT: 52 % — AB (ref 12–46)
MCH: 27.7 pg (ref 26.0–34.0)
MCHC: 32.5 g/dL (ref 30.0–36.0)
MCV: 85.3 fL (ref 78.0–100.0)
MONOS PCT: 9 % (ref 3–12)
MPV: 12.8 fL — AB (ref 8.6–12.4)
Monocytes Absolute: 0.3 10*3/uL (ref 0.1–1.0)
Neutro Abs: 1.4 10*3/uL — ABNORMAL LOW (ref 1.7–7.7)
Neutrophils Relative %: 38 % — ABNORMAL LOW (ref 43–77)
Platelets: 170 10*3/uL (ref 150–400)
RBC: 4.55 MIL/uL (ref 3.87–5.11)
RDW: 14.5 % (ref 11.5–15.5)
WBC: 3.8 10*3/uL — ABNORMAL LOW (ref 4.0–10.5)

## 2015-04-08 LAB — POCT URINE PREGNANCY: PREG TEST UR: NEGATIVE

## 2015-04-08 LAB — TSH: TSH: 0.489 u[IU]/mL (ref 0.350–4.500)

## 2015-04-08 NOTE — Patient Instructions (Addendum)
It was nice seeing you today, I am sorry about your heavy bleeding. We will check your hormones to make sure this is not the cause. If normal we might need to get pelvic ultrasound or switch you to a different birth control if no improvement. I will like to see you back in about 2 wks.   Menorrhagia Menorrhagia is when your menstrual periods are heavy or last longer than usual.  HOME CARE  Only take medicine as told by your doctor.  Take any iron pills as told by your doctor. Heavy bleeding may cause low levels of iron in your body.  Do not take aspirin 1 week before or during your period. Aspirin can make the bleeding worse.  Lie down for a while if you change your tampon or pad more than once in 2 hours. This may help lessen the bleeding.  Eat a healthy diet and foods with iron. These foods include leafy green vegetables, meat, liver, eggs, and whole grain breads and cereals.  Do not try to lose weight. Wait until the heavy bleeding has stopped and your iron level is normal. GET HELP IF:  You soak through a pad or tampon every 1 or 2 hours, and this happens every time you have a period.  You need to use pads and tampons at the same time because you are bleeding so much.  You need to change your pad or tampon during the night.  You have a period that lasts for more than 8 days.  You pass clots bigger than 1 inch (2.5 cm) wide.  You have irregular periods that happen more or less often than once a month.  You feel dizzy or pass out (faint).  You feel very weak or tired.  You feel short of breath or feel your heart is beating too fast when you exercise.  You feel sick to your stomach (nausea) and you throw up (vomit) while you are taking your medicine.   You have watery poop (diarrhea) while you are taking your medicine.  You have any problems that may be related to the medicine you are taking.  GET HELP RIGHT AWAY IF:  You soak through 4 or more pads or tampons in 2  hours.  You have any bleeding while you are pregnant. MAKE SURE YOU:   Understand these instructions.  Will watch your condition.  Will get help right away if you are not doing well or get worse. Document Released: 06/26/2008 Document Revised: 05/20/2013 Document Reviewed: 03/19/2013 [facility] Patient Information 2015 [place]. This information is not intended to replace advice given to you by your health care provider. Make sure you discuss any questions you have with your health care provider.

## 2015-04-08 NOTE — Assessment & Plan Note (Addendum)
This might be related to her being on OCP vs hormonal abnormality. Check TSH, LH/FSH, DHEAS. CBC checked to r/o anemia especially with associated intermittent dizziness. If lab is normal and she is still symptomatic, I will consider pelvic U/S and or switch her birth control to Depo which she had done well on in the past. Urine pregnancy test done today was normal. F/U in 2 wks for reassessment.

## 2015-04-08 NOTE — Progress Notes (Signed)
Subjective:     Patient ID: Kaitlyn Espinoza, female   DOB: [birth year], 24 y.o.   MRN: [id number]  HPI  Menstrual problem: C/O heavy period which started 1 months ago. 2 wks ago her period lasted for 9 days and it was heavy and she continued to spot continuously since then. She has been losing weight fast. She had similar problem in the past and was informed she had problem with her thyroid back then. She had blood work then but not treated. Currently sexually active, uses condoms irregularly, she does not feel she might be pregnant. She takes her birth control regularly.  She had been on same birth control (Loestrin 2624 FE) for 1 year, she never bled this heavily since being on her OCP till 1 month ago. Denies ever missing her pills, she is regular and compliant with her medications. She had been on nexplanon in the past but she bled on it for a year. She did well on Depo in the past. She feels dizzy on and off, denies any belly pain.  Current Outpatient Prescriptions on File Prior to Visit  Medication Sig Dispense Refill  . Norethindrone Acetate-Ethinyl Estrad-FE (LOESTRIN 24 FE) 1-20 MG-MCG(24) tablet Take 1 tablet by mouth daily. 1 Package 6  . albuterol (PROVENTIL HFA) 108 (90 BASE) MCG/ACT inhaler Inhale 2 puffs into the lungs every 6 (six) hours as needed for wheezing. 1 Inhaler 0   Current Facility-Administered Medications on File Prior to Visit  Medication Dose Route Frequency Provider Last Rate Last Dose  . tuberculin injection 5 Units  5 Units Intradermal Once Macy MisKim K Briscoe, MD       Past Medical History  Diagnosis Date  . Asthma      Review of Systems  Constitutional: Positive for unexpected weight change. Negative for fever, activity change and fatigue.  Respiratory: Negative.   Cardiovascular: Negative.   Gastrointestinal: Negative.   Genitourinary: Positive for menstrual problem.  Neurological: Positive for dizziness.  All other systems reviewed and are negative.  Filed  Vitals:   04/08/15 1037  BP: 103/58  Pulse: 85  Temp: 98.8 F (37.1 C)  TempSrc: Oral  Weight: 158 lb (71.668 kg)       Objective:   Physical Exam  Constitutional: She appears well-developed. No distress.  Cardiovascular: Normal rate, regular rhythm, normal heart sounds and intact distal pulses.   No murmur heard. Pulmonary/Chest: Effort normal and breath sounds normal. No respiratory distress. She has no wheezes.  Abdominal: Soft. Bowel sounds are normal. She exhibits no distension and no mass. There is no tenderness.  Genitourinary: There is no lesion on the right labia. There is no lesion on the left labia. Cervix exhibits no motion tenderness and no discharge. There is bleeding in the vagina. No vaginal discharge found.    Nursing note and vitals reviewed.      Assessment:     menorrhagia     Plan:     Check problem list.

## 2015-04-09 LAB — FSH/LH
FSH: 3 m[IU]/mL
LH: 2 m[IU]/mL

## 2015-04-09 LAB — DHEA-SULFATE: DHEA-SO4: 104 ug/dL (ref 18–391)

## 2015-04-11 ENCOUNTER — Telehealth: Payer: Self-pay | Admitting: *Deleted

## 2015-04-11 ENCOUNTER — Telehealth: Payer: Self-pay | Admitting: Family Medicine

## 2015-04-11 ENCOUNTER — Encounter: Payer: Self-pay | Admitting: *Deleted

## 2015-04-11 NOTE — Telephone Encounter (Signed)
LM for patient to call back. Cailan General,CMA  

## 2015-04-11 NOTE — Telephone Encounter (Signed)
Spoke with patient regarding labs.  States that she is interested in having the ultrasound done to see why she might still be bleeding.  She has since stopped her pills and has bled constantly since last seen in clinic.  She would like to have get this scheduled on Friday so she can be seen in clinic with you on the same day. Livier Hendel,CMA

## 2015-04-11 NOTE — Telephone Encounter (Signed)
Pt called and would like someone to call her about her lab results from last week. jw

## 2015-04-11 NOTE — Telephone Encounter (Signed)
-----   Message from Doreene ElandKehinde T Eniola, MD sent at 04/11/2015  6:56 AM EDT ----- Please inform patient that her hormone test are within normal range. If still having issues with her period, have her see me soon.

## 2015-04-11 NOTE — Telephone Encounter (Signed)
Letter mailed to patient with normal results. Jazmin Hartsell,CMA  

## 2015-04-12 NOTE — Telephone Encounter (Signed)
Ok. Thanks!

## 2015-04-29 ENCOUNTER — Encounter: Payer: Self-pay | Admitting: Family Medicine

## 2015-04-29 ENCOUNTER — Ambulatory Visit (INDEPENDENT_AMBULATORY_CARE_PROVIDER_SITE_OTHER): Payer: Self-pay | Admitting: Family Medicine

## 2015-04-29 VITALS — BP 105/62 | HR 83 | Temp 98.7°F | Ht 65.5 in | Wt 161.0 lb

## 2015-04-29 DIAGNOSIS — N921 Excessive and frequent menstruation with irregular cycle: Secondary | ICD-10-CM

## 2015-04-29 LAB — POCT URINE PREGNANCY: Preg Test, Ur: NEGATIVE

## 2015-04-29 NOTE — Patient Instructions (Signed)
It was nice seeing you today. I am glad your bleeding stopped. Let us see how you do after restarting your birth control. If you start bleeding again, then we will need to switch your birth control. Follow-up as needed.

## 2015-04-29 NOTE — Progress Notes (Signed)
Subjective:     Patient ID: Kaitlyn Espinoza, female   DOB: 11-Nov-1990, 24 y.o.   MRN: [id number]  HPI Irregular menses:She is here for follow-up with her vaginal bleeding. She stated her bleeding stopped about 1 wk ago and then she restarted her birth control soon after she stopped bleeding. Since then she had been fine. She denies any vaginal discharge. No abdominal or pelvic pain. Feeling ok otherwise.  Current Outpatient Prescriptions on File Prior to Visit  Medication Sig Dispense Refill  . Norethindrone Acetate-Ethinyl Estrad-FE (LOESTRIN 24 FE) 1-20 MG-MCG(24) tablet Take 1 tablet by mouth daily. 1 Package 6  . albuterol (PROVENTIL HFA) 108 (90 BASE) MCG/ACT inhaler Inhale 2 puffs into the lungs every 6 (six) hours as needed for wheezing. 1 Inhaler 0   Current Facility-Administered Medications on File Prior to Visit  Medication Dose Route Frequency Provider Last Rate Last Dose  . tuberculin injection 5 Units  5 Units Intradermal Once Macy Mis, MD       Past Medical History  Diagnosis Date  . Asthma      Review of Systems  Respiratory: Negative.   Cardiovascular: Negative.   Gastrointestinal: Negative.   Genitourinary: Negative.   All other systems reviewed and are negative.  Filed Vitals:   04/29/15 0845  BP: 105/62  Pulse: 83  Temp: 98.7 F (37.1 C)  TempSrc: Oral  Height: 5' 5.5" (1.664 m)  Weight: 161 lb (73.029 kg)       Objective:   Physical Exam  Constitutional: She appears well-developed. No distress.  Cardiovascular: Normal rate, regular rhythm, normal heart sounds and intact distal pulses.   No murmur heard. Pulmonary/Chest: Effort normal and breath sounds normal. No respiratory distress. She has no wheezes.  Abdominal: Soft. Bowel sounds are normal. She exhibits no distension and no mass. There is no tenderness.  Nursing note and vitals reviewed.      Assessment:   Irregular menses     Plan:     Check problem list

## 2015-04-29 NOTE — Assessment & Plan Note (Signed)
Patient's symptoms resolved. She is now concern she might be pregnant since she had been sexually active during the time she was off birth control. She will like to get a pregnancy test done today. Urine obtained for Bhcg I will contact her with test result.

## 2015-05-05 ENCOUNTER — Encounter: Payer: Self-pay | Admitting: Family Medicine

## 2015-05-05 ENCOUNTER — Ambulatory Visit (INDEPENDENT_AMBULATORY_CARE_PROVIDER_SITE_OTHER): Payer: Medicaid Other | Admitting: Family Medicine

## 2015-05-05 ENCOUNTER — Other Ambulatory Visit (HOSPITAL_COMMUNITY)
Admission: RE | Admit: 2015-05-05 | Discharge: 2015-05-05 | Disposition: A | Discharge status: Home / Self Care | Payer: Medicaid Other | Source: Ambulatory Visit | Attending: Family Medicine | Admitting: Family Medicine

## 2015-05-05 VITALS — BP 110/63 | HR 82 | Temp 98.2°F | Ht 65.5 in | Wt 163.8 lb

## 2015-05-05 DIAGNOSIS — Z113 Encounter for screening for infections with a predominantly sexual mode of transmission: Secondary | ICD-10-CM | POA: Insufficient documentation

## 2015-05-05 DIAGNOSIS — B3731 Acute candidiasis of vulva and vagina: Secondary | ICD-10-CM

## 2015-05-05 DIAGNOSIS — B373 Candidiasis of vulva and vagina: Secondary | ICD-10-CM | POA: Diagnosis present

## 2015-05-05 LAB — POCT WET PREP (WET MOUNT): CLUE CELLS WET PREP WHIFF POC: NEGATIVE

## 2015-05-05 LAB — POCT URINE PREGNANCY: PREG TEST UR: NEGATIVE

## 2015-05-05 MED ORDER — FLUCONAZOLE 150 MG PO TABS: 150.0000 mg | ORAL_TABLET | Freq: Once | ORAL | Status: DC

## 2015-05-05 NOTE — Progress Notes (Signed)
    Subjective   Kaitlyn Espinoza is a 24 y.o. female that presents for a same day visit  1. Vaginal discharge: Symptoms started a week ago and occurred two days after having a bikini wax. She has associated itching and swelling. No foul odor, dysuria or vaginal bleeding. She currently has sex with males only and is currently sexually active. She does not use condoms and uses her oral contraceptive pill intermittently. No fevers, nausea or vomiting.  ROS Per HPI  History  Substance Use Topics  . Smoking status: Never Smoker   . Smokeless tobacco: Not on file  . Alcohol Use: No    No Known Allergies  Objective   BP 110/63 mmHg  Pulse 82  Temp(Src) 98.2 F (36.8 C) (Oral)  Ht 5' 5.5" (1.664 m)  Wt 163 lb 12.8 oz (74.299 kg)  BMI 26.83 kg/m2  LMP 03/25/2015 (Approximate)  General: Well appearing, no distress Genitourinary: No external vaginal lesions. No obvious swelling. Minimal amounts of white discharge. Cervix appears normal with no discharge  Assessment and Plan   Meds ordered this encounter  Medications  . fluconazole (DIFLUCAN) 150 MG tablet    Sig: Take 1 tablet (150 mg total) by mouth once.    Dispense:  1 tablet    Refill:  1    Candida vaginitis: pregnancy negative Vaginal discharge  Fluconazole  x1  Follow-up gonorrhea/chlamydia

## 2015-05-05 NOTE — Patient Instructions (Signed)
Thank you for coming to see me today. It was a pleasure. Today we talked about:   Vaginal discharge: You will get a call with results of your test today. If positive, I will call in a prescription for you.  If you have any questions or concerns, please do not hesitate to call the office at [phone].  Sincerely,  Jacquelin Hawking, MD

## 2015-05-06 ENCOUNTER — Encounter: Payer: Self-pay | Admitting: Family Medicine

## 2015-05-06 ENCOUNTER — Telehealth: Payer: Self-pay | Admitting: Family Medicine

## 2015-05-06 LAB — CERVICOVAGINAL ANCILLARY ONLY
Chlamydia: NEGATIVE
NEISSERIA GONORRHEA: NEGATIVE

## 2015-05-06 NOTE — Telephone Encounter (Signed)
Per patient's request I left test result message on her phone. + Yeast, diflucan already called in to her pharmacy which I advised she picks up. Other test result pending. Hopefully Dr Caleb Popp will call her soon as he gets the result.

## 2015-05-06 NOTE — Telephone Encounter (Signed)
Pt calling, frustrated, she was supposed to receive a phone call yesterday about her results when seen by Dr. Caleb Popp. She would like to receive these results as soon as possible so she can start treatment if necessary. Her concern is of "unprofessionalism". She states the best time to call her phone number [phone] is at 2pm as that is the time she usually takes break at work. She is giving permission to leave a voice message with the results on the voicemail. Please advise at the earliest convenience. Thank you, Kaitlyn Espinoza, ASA

## 2015-05-08 NOTE — Telephone Encounter (Signed)
I had called patient at listed number and left message (I had not yet closed my office encounter, so did not create a new telephone encounter) for her to return call to the office for results but stated in the voice mail that a prescription was sent to her pharmacy. I had also stated, before she left the office, that GC/chlamydia would take longer. She will get a letter in the mail regarding these results. I see Dr. Lum Babe has already called her back regarding the wet prep results.

## 2015-07-06 ENCOUNTER — Telehealth: Payer: Self-pay | Admitting: Family Medicine

## 2015-07-06 ENCOUNTER — Other Ambulatory Visit: Payer: Self-pay | Admitting: Family Medicine

## 2015-07-06 DIAGNOSIS — N920 Excessive and frequent menstruation with regular cycle: Secondary | ICD-10-CM

## 2015-07-06 NOTE — Telephone Encounter (Signed)
Pelvic U/S ordered. Inform patient and schedule appointment. Thank you.

## 2015-07-06 NOTE — Telephone Encounter (Signed)
Pt was seen by Dr. Lum Babe a few months ago for bleeding. The doctor order her to have an [place], but since she stopped bleeding she never went. She has once again started the bleeding and would like the doctor to go a head and re-order the [place] for her. Please call patient so she knows to make the appointment. jw

## 2015-07-06 NOTE — Telephone Encounter (Signed)
Will forward to PCP for review to see if [place] can be re-ordered or if pt needs to be re-evaluated. Last time pt was seen about this was on 04/08/15 and 04/29/15. Robbi Spells, CMA.

## 2015-07-06 NOTE — Telephone Encounter (Signed)
Will forward to referral coordinator to schedule appt. Zitlaly Malson, CMA.

## 2015-07-07 ENCOUNTER — Telehealth: Payer: Self-pay | Admitting: Family Medicine

## 2015-07-07 NOTE — Telephone Encounter (Signed)
LMOVM. Please advise patient that her U/S has been scheduled for Fri 10/14 @ 10 AM (arrive at 9:45am). Please drink at least 32 oz of water 1 hr prior to this appt (full bladder). She can call scheduling at [phone] if this does not work for her.

## 2015-07-15 ENCOUNTER — Telehealth: Payer: Self-pay | Admitting: Family Medicine

## 2015-07-15 ENCOUNTER — Ambulatory Visit (HOSPITAL_COMMUNITY)
Admission: RE | Admit: 2015-07-15 | Discharge: 2015-07-15 | Disposition: A | Discharge status: Home / Self Care | Payer: Medicaid Other | Source: Ambulatory Visit | Attending: Family Medicine | Admitting: Family Medicine

## 2015-07-15 DIAGNOSIS — N854 Malposition of uterus: Secondary | ICD-10-CM | POA: Insufficient documentation

## 2015-07-15 DIAGNOSIS — N920 Excessive and frequent menstruation with regular cycle: Secondary | ICD-10-CM

## 2015-07-15 NOTE — Telephone Encounter (Signed)
Pelvic U/S report discussed with patient. Advised to follow up to discuss further management.   Blue team please contact patient to schedule follow up appointment with me soon.

## 2015-07-18 NOTE — Telephone Encounter (Signed)
appt scheduled for 07/19/15. Ranita Stjulien,CMA

## 2015-07-19 ENCOUNTER — Encounter: Payer: Self-pay | Admitting: Family Medicine

## 2015-07-19 ENCOUNTER — Ambulatory Visit (INDEPENDENT_AMBULATORY_CARE_PROVIDER_SITE_OTHER): Payer: Self-pay | Admitting: Family Medicine

## 2015-07-19 VITALS — BP 102/61 | HR 76 | Temp 98.2°F | Ht 66.0 in | Wt 164.4 lb

## 2015-07-19 DIAGNOSIS — E282 Polycystic ovarian syndrome: Secondary | ICD-10-CM | POA: Insufficient documentation

## 2015-07-19 DIAGNOSIS — N921 Excessive and frequent menstruation with irregular cycle: Secondary | ICD-10-CM

## 2015-07-19 LAB — POCT GLYCOSYLATED HEMOGLOBIN (HGB A1C): Hemoglobin A1C: 5.2

## 2015-07-19 LAB — BASIC METABOLIC PANEL
BUN: 11 mg/dL (ref 7–25)
CALCIUM: 8.9 mg/dL (ref 8.6–10.2)
CHLORIDE: 106 mmol/L (ref 98–110)
CO2: 24 mmol/L (ref 20–31)
Creat: 0.61 mg/dL (ref 0.50–1.10)
Glucose, Bld: 82 mg/dL (ref 65–99)
POTASSIUM: 4.3 mmol/L (ref 3.5–5.3)
SODIUM: 140 mmol/L (ref 135–146)

## 2015-07-19 MED ORDER — ESTROGENS CONJUGATED 1.25 MG PO TABS: 1.2500 mg | ORAL_TABLET | Freq: Two times a day (BID) | ORAL | Status: DC

## 2015-07-19 MED ORDER — MEDROXYPROGESTERONE ACETATE 10 MG PO TABS: 10.0000 mg | ORAL_TABLET | Freq: Every day | ORAL | Status: DC

## 2015-07-19 NOTE — Assessment & Plan Note (Signed)
Lab test result does not suggest PCOS. However U/S is suggestive of it. Patient also has hx of infertility as well as previous episode of amenorrhea followed by prolonged menstrual bleeding. Plan to control her bleeding now. Consider Metformin to improve fertility rate. Consider Fertility clinic referral if needed. Once her bleeding is controlled, I will start her on OCP. F/U in 3-4 weeks for reassessment.

## 2015-07-19 NOTE — Patient Instructions (Signed)
I am sorry you are still bleeding. I will start you on high dose estrogen for 14 days or till you stop bleeding ( Premarin) Then start Provera for 10 days. See me back in 3 wks for reassessment.   Menorrhagia Menorrhagia is when your menstrual periods are heavy or last longer than usual.  HOME CARE  Only take medicine as told by your doctor.  Take any iron pills as told by your doctor. Heavy bleeding may cause low levels of iron in your body.  Do not take aspirin 1 week before or during your period. Aspirin can make the bleeding worse.  Lie down for a while if you change your tampon or pad more than once in 2 hours. This may help lessen the bleeding.  Eat a healthy diet and foods with iron. These foods include leafy green vegetables, meat, liver, eggs, and whole grain breads and cereals.  Do not try to lose weight. Wait until the heavy bleeding has stopped and your iron level is normal. GET HELP IF:  You soak through a pad or tampon every 1 or 2 hours, and this happens every time you have a period.  You need to use pads and tampons at the same time because you are bleeding so much.  You need to change your pad or tampon during the night.  You have a period that lasts for more than 8 days.  You pass clots bigger than 1 inch (2.5 cm) wide.  You have irregular periods that happen more or less often than once a month.  You feel dizzy or pass out (faint).  You feel very weak or tired.  You feel short of breath or feel your heart is beating too fast when you exercise.  You feel sick to your stomach (nausea) and you throw up (vomit) while you are taking your medicine.   You have watery poop (diarrhea) while you are taking your medicine.  You have any problems that may be related to the medicine you are taking.  GET HELP RIGHT AWAY IF:  You soak through 4 or more pads or tampons in 2 hours.  You have any bleeding while you are pregnant. MAKE SURE YOU:   Understand these  instructions.  Will watch your condition.  Will get help right away if you are not doing well or get worse.   This information is not intended to replace advice given to you by your health care provider. Make sure you discuss any questions you have with your health care provider.   Document Released: 06/26/2008 Document Revised: 05/20/2013 Document Reviewed: 03/19/2013 Elsevier Interactive Patient Education [employer]

## 2015-07-19 NOTE — Assessment & Plan Note (Signed)
Patient was prescribed Norethindrone Acetate-Ethinyl Estrad-FE (LOESTRIN 24 FE) 1-20 MG-MCG(24) tablet This was ineffective hence she stopped med. Plan for urine pregnancy test today ( Note patient left without giving her urine, I called her back and she agreed to return tomorrow morning for test). Recent lab including TSH had been normal. Pelvic U/S shows normal uterine lining and and multiple peripheral cyst on ovaries suggestive of PCOS. Patient is distressed about her bleeding x 5 wks now. High dose estrogen prescribed followed by provera. Instruction given on how to use medication. Advised to start medication only after urine pregnancy test is negative. She agreed with plan. I will reassess her in 3-4 weeks.  Return precaution discussed. CBC checked today due to hx of dizziness.

## 2015-07-19 NOTE — Progress Notes (Signed)
Subjective:     Patient ID: Kaitlyn Espinoza, female   DOB: 1991-08-01, 24 y.o.   MRN: [id number]  HPI PCOS/ Irregular menses: Here to follow up for irregular menstrual bleeding and pelvic U/S result.She stated she is still bleeding x 5 weeks now. She denies any belly pain, no nausea or vomiting. Feesl weak and dizzy at times. Denies any urinary symptoms. She tried to get pregnant few months ago but it did not happen, she will like to get pregnant some time. Here to discuss treatment option.  Current Outpatient Prescriptions on File Prior to Visit  Medication Sig Dispense Refill  . Norethindrone Acetate-Ethinyl Estrad-FE (LOESTRIN 24 FE) 1-20 MG-MCG(24) tablet Take 1 tablet by mouth daily. 1 Package 6  . albuterol (PROVENTIL HFA) 108 (90 BASE) MCG/ACT inhaler Inhale 2 puffs into the lungs every 6 (six) hours as needed for wheezing. 1 Inhaler 0   Current Facility-Administered Medications on File Prior to Visit  Medication Dose Route Frequency Provider Last Rate Last Dose  . tuberculin injection 5 Units  5 Units Intradermal Once Macy MisKim K Briscoe, MD       Past Medical History  Diagnosis Date  . Asthma      Review of Systems  Respiratory: Negative.   Cardiovascular: Negative.   Gastrointestinal: Negative.   Genitourinary: Positive for vaginal bleeding and menstrual problem. Negative for dysuria, vaginal discharge, vaginal pain and pelvic pain.  All other systems reviewed and are negative.  Filed Vitals:   07/19/15 1016  BP: 102/61  Pulse: 76  Temp: 98.2 F (36.8 C)  TempSrc: Oral  Height: 5\' 6"  (1.676 m)  Weight: 164 lb 7 oz (74.588 kg)       Objective:   Physical Exam  Constitutional: She appears well-developed. No distress.  Cardiovascular: Normal rate, regular rhythm, normal heart sounds and intact distal pulses.   No murmur heard. Pulmonary/Chest: Effort normal and breath sounds normal. No respiratory distress. She has no wheezes.  Abdominal: Soft. Bowel sounds are normal. She  exhibits no distension and no mass. There is no tenderness.  Genitourinary: Uterus normal. Pelvic exam was performed with patient supine. Cervix exhibits no discharge. There is bleeding in the vagina. No tenderness in the vagina.    Nursing note and vitals reviewed.      Assessment:     Menorrhagia PCOS     Plan:     Check problem list.

## 2015-07-20 ENCOUNTER — Telehealth: Payer: Self-pay | Admitting: Family Medicine

## 2015-07-20 ENCOUNTER — Other Ambulatory Visit: Payer: Self-pay | Admitting: Family Medicine

## 2015-07-20 ENCOUNTER — Ambulatory Visit (INDEPENDENT_AMBULATORY_CARE_PROVIDER_SITE_OTHER): Payer: Self-pay | Admitting: *Deleted

## 2015-07-20 DIAGNOSIS — D696 Thrombocytopenia, unspecified: Secondary | ICD-10-CM

## 2015-07-20 DIAGNOSIS — N921 Excessive and frequent menstruation with irregular cycle: Secondary | ICD-10-CM

## 2015-07-20 DIAGNOSIS — Z202 Contact with and (suspected) exposure to infections with a predominantly sexual mode of transmission: Secondary | ICD-10-CM

## 2015-07-20 DIAGNOSIS — E282 Polycystic ovarian syndrome: Secondary | ICD-10-CM

## 2015-07-20 LAB — CBC
HCT: 37.5 % (ref 36.0–46.0)
Hemoglobin: 12 g/dL (ref 12.0–15.0)
MCH: 27 pg (ref 26.0–34.0)
MCHC: 32 g/dL (ref 30.0–36.0)
MCV: 84.5 fL (ref 78.0–100.0)
PLATELETS: 129 10*3/uL — AB (ref 150–400)
RBC: 4.44 MIL/uL (ref 3.87–5.11)
RDW: 15.3 % (ref 11.5–15.5)
WBC: 4 10*3/uL (ref 4.0–10.5)

## 2015-07-20 LAB — PROTIME-INR
INR: 1.09 (ref <1.50)
Prothrombin Time: 14.2 seconds (ref 11.6–15.2)

## 2015-07-20 LAB — POCT URINE PREGNANCY: PREG TEST UR: NEGATIVE

## 2015-07-20 NOTE — Telephone Encounter (Signed)
Pt calls stating that prior-auth is needed for the Premarin that was prescribed by Dr. Lum BabeEniola. Advised patient to have pharmacy send over PA form and that Tamika was take care it. Pt requesting this be expedited due to the fact that she is still bleeding. Please advise.

## 2015-07-20 NOTE — Telephone Encounter (Signed)
Call back to discuss test result. I also left message about coming for her urine pregnancy test.

## 2015-07-20 NOTE — Progress Notes (Signed)
She returned for lab. U preg done. I discussed blood test result with her, concern about thrombocytopenia which could be due to platelet clumping together, but due to current bleeding and will check coagulation profile. INR, PT ordered today. She also requested for HIV testing she stated she wanted to be sure she is negative. This was ordered.

## 2015-07-20 NOTE — Telephone Encounter (Signed)
Patient informed that the Premarin is not covered under the [facility]. She would have to pay out of pocket for the medication. It would cost her $80.  Patient is also requesting her lab results. Will forward to PCP.  Clovis PuMartin, Varnika Butz L, RN

## 2015-07-21 ENCOUNTER — Telehealth: Payer: Self-pay | Admitting: Family Medicine

## 2015-07-21 LAB — HIV ANTIBODY (ROUTINE TESTING W REFLEX): HIV 1&2 Ab, 4th Generation: NONREACTIVE

## 2015-07-21 LAB — RPR

## 2015-07-21 MED ORDER — DESOGESTREL-ETHINYL ESTRADIOL 0.15-30 MG-MCG PO TABS: 1.0000 | ORAL_TABLET | Freq: Every day | ORAL | Status: DC

## 2015-07-21 MED ORDER — PROMETHAZINE HCL 12.5 MG RE SUPP: 12.5000 mg | Freq: Four times a day (QID) | RECTAL | Status: DC | PRN

## 2015-07-21 NOTE — Telephone Encounter (Addendum)
I discussed test result with patient. PT/INR normal. Monitor platelet level for now, likely recheck in few months. Patient also mentioned due to cost she was unable to start her premarin and provera. Plan is to prescribed OCP and take higher dose in the first 3-4 days and then take one tablet daily till she completes the prescription. Phenergan prescribed prn nausea. Patient agreed with plan.

## 2015-07-21 NOTE — Telephone Encounter (Signed)
I called patient this morning to discuss medication. Message left for her to call me back as I was unable to reach her.

## 2015-08-01 ENCOUNTER — Telehealth: Payer: Self-pay | Admitting: Family Medicine

## 2015-08-01 NOTE — Telephone Encounter (Signed)
Please let her know the Metformin those not have anything to do with stopping bleeding. We will talk about it during her next visit. I need her to continue her birth control, if she is out of it let me know so I can get her refill.

## 2015-08-01 NOTE — Telephone Encounter (Signed)
Will forward to MD.  Patient's appt is on 08/16/15. Neev Mcmains,CMA

## 2015-08-01 NOTE — Telephone Encounter (Signed)
The bleeding stopped about 3 days after starting the medication.  She takes her last pill on 08/11/15. She would like to start metformin.can she start the metrformin just as she ends the other mediciation? She has an appt 10/15 and is afraid the bleeding may start again

## 2015-08-01 NOTE — Telephone Encounter (Signed)
Patient will need a refill on her birth control to last until she sees you.  She would like to know if she can start the metformin before coming in to see you or will she have to wait until then.  Please clarify.  Thank you Zackarie Chason,CMA

## 2015-08-02 ENCOUNTER — Other Ambulatory Visit: Payer: Self-pay | Admitting: Family Medicine

## 2015-08-02 MED ORDER — DESOGESTREL-ETHINYL ESTRADIOL 0.15-30 MG-MCG PO TABS: 1.0000 | ORAL_TABLET | Freq: Every day | ORAL | Status: DC

## 2015-08-02 NOTE — Telephone Encounter (Signed)
I refilled her BC. I will talk to her about Metformin at next visit. She might not need to start it for now.

## 2015-08-02 NOTE — Telephone Encounter (Signed)
Patient voiced understanding of the message. Kaitlyn Espinoza,CMA

## 2015-08-16 ENCOUNTER — Encounter: Payer: Self-pay | Admitting: Family Medicine

## 2015-08-16 ENCOUNTER — Ambulatory Visit (INDEPENDENT_AMBULATORY_CARE_PROVIDER_SITE_OTHER): Payer: Self-pay | Admitting: Family Medicine

## 2015-08-16 VITALS — BP 113/65 | HR 79 | Temp 98.3°F | Ht 66.0 in | Wt 165.4 lb

## 2015-08-16 DIAGNOSIS — N921 Excessive and frequent menstruation with irregular cycle: Secondary | ICD-10-CM

## 2015-08-16 DIAGNOSIS — E282 Polycystic ovarian syndrome: Secondary | ICD-10-CM

## 2015-08-16 DIAGNOSIS — N979 Female infertility, unspecified: Secondary | ICD-10-CM | POA: Insufficient documentation

## 2015-08-16 LAB — POCT URINE PREGNANCY: Preg Test, Ur: NEGATIVE

## 2015-08-16 MED ORDER — METFORMIN HCL 500 MG PO TABS: 500.0000 mg | ORAL_TABLET | Freq: Two times a day (BID) | ORAL | Status: DC

## 2015-08-16 MED ORDER — PROMETHAZINE HCL 12.5 MG PO TABS: 12.5000 mg | ORAL_TABLET | Freq: Four times a day (QID) | ORAL | Status: DC | PRN

## 2015-08-16 NOTE — Patient Instructions (Signed)
Canagliflozin; Metformin oral tablets What is this medicine? CANAGLIFLOZIN; METFORMIN (KAN a gli FLOE zin; met FOR min) is a combination of 2 medicines used to treat type 2 diabetes. This medicine lowers blood sugar. Treatment is combined with a balanced diet and exercise. This medicine may be used for other purposes; ask your health care provider or pharmacist if you have questions. What should I tell my health care provider before I take this medicine? They need to know if you have any of these conditions: -anemia -dehydration -diabetic ketoacidosis -diet low in salt -eating less due to illness, surgery, dieting, or any other reason -having surgery -heart disease -high cholesterol -high levels of potassium in the blood -history of pancreatitis or pancreas problems -history of yeast infection of the penis or vagina -if you often drink alcohol -infections in the bladder, kidneys, or urinary tract -kidney disease -liver disease -low blood pressure -on hemodialysis -polycystic ovary syndrome -problems urinating -serious infection or injury -type 1 diabetes -uncircumcised female -vomiting -an unusual or allergic reaction to canagliflozin, metformin, other medicines, foods, dyes, or preservatives -pregnant or trying to get pregnant -breast-feeding How should I use this medicine? Take this medicine by mouth with a glass of water. Take this medicine with food. Follow the directions on the prescription label. Take your doses at regular intervals. Do not take your medicine more often than directed. Do not stop taking except on your doctor's advice. A special MedGuide will be given to you by the pharmacist with each prescription and refill. Be sure to read this information carefully each time. Talk to your pediatrician regarding the use of this medicine in children. Special care may be needed. Overdosage: If you think you have taken too much of this medicine contact a poison control center  or emergency room at once. NOTE: This medicine is only for you. Do not share this medicine with others. What if I miss a dose? If you miss a dose, take it as soon as you can. If it is almost time for your next dose, take only that dose. Do not take double or extra doses. What may interact with this medicine? Do not take this medicine with any of the following medications: -dofetilide -gatifloxacin -certain contrast medicines given before X-rays, CT scans, MRI, or other procedures This medicine may also interact with the following medications: -acetazolamide -alcohol -certain antiviral medicines for HIV infection or hepatitis -cimetidine -crizotinib -digoxin -diuretics -female hormones, like estrogens or progestins and birth control pills -glycopyrrolate -isoniazid -lamotrigine -medicines for blood pressure, heart disease, irregular heart beat -memantine -methazolamide -midodrine -morphine -nicotinic acid -phenobarbital -phenothiazines like chlorpromazine, mesoridazine, prochlorperazine, thioridazine -phenytoin -procainamide -propantheline -quinidine -quinine -ranitidine -ranolazine -rifampin -ritonavir -steroid medicines like prednisone or cortisone -stimulant medicines for attention disorders, weight loss, or to stay awake -thyroid medicines -topiramate -trimethoprim -trospium -vancomycin -vandetanib -zonisamide This list may not describe all possible interactions. Give your health care provider a list of all the medicines, herbs, non-prescription drugs, or dietary supplements you use. Also tell them if you smoke, drink alcohol, or use illegal drugs. Some items may interact with your medicine. What should I watch for while using this medicine? Visit your doctor or health care professional for regular checks on your progress. This medicine can cause a serious condition in which there is too much acid in the blood. If you develop nausea, vomiting, stomach pain,  unusual tiredness, or breathing problems, stop taking this medicine and call your doctor right away. If possible, use a ketone dipstick to check  for ketones in your urine. A test called the HbA1C (A1C) will be monitored. This is a simple blood test. It measures your blood sugar control over the last 2 to 3 months. You will receive this test every 3 to 6 months. Learn how to check your blood sugar. Learn the symptoms of low and high blood sugar and how to manage them. Always carry a quick-source of sugar with you in case you have symptoms of low blood sugar. Examples include hard sugar candy or glucose tablets. Make sure others know that you can choke if you eat or drink when you develop serious symptoms of low blood sugar, such as seizures or unconsciousness. They must get medical help at once. Tell your doctor or health care professional if you have high blood sugar. You might need to change the dose of your medicine. If you are sick or exercising more than usual, you might need to change the dose of your medicine. Do not skip meals. Ask your doctor or health care professional if you should avoid alcohol. Many nonprescription cough and cold products contain sugar or alcohol. These can affect blood sugar. This medicine may cause ovulation in premenopausal women who do not have regular monthly periods. This may increase your chances of becoming pregnant. You should not take this medicine if you become pregnant or think you may be pregnant. Talk with your doctor or health care professional about your birth control options while taking this medicine. Contact your doctor or health care professional right away if think you are pregnant. If you are going to need surgery, a MRI, CT scan, or other procedure, tell your doctor that you are taking this medicine. You may need to stop taking this medicine before the procedure. Wear a medical ID bracelet or chain, and carry a card that describes your disease and details  of your medicine and dosage times. What side effects may I notice from receiving this medicine? Side effects that you should report to your doctor or health care professional as soon as possible: -allergic reactions like skin rash, itching or hives, swelling of the face, lips, or tongue -breathing problems -chest pain -dizziness -feeling faint or lightheaded, falls -muscle aches or pains -muscle weakness -nausea, vomiting, unusual stomach upset or pain -new pain or tenderness, change in skin color, sores or ulcers, or infection in legs or feet -signs and symptoms of low blood sugar such as feeling anxious, confusion, dizziness, increased hunger, unusually weak or tired, sweating, shakiness, cold, irritable, headache, blurred vision, fast heartbeat, loss of consciousness -signs and symptoms of a urinary tract infection, such as fever, chills, a burning feeling when urinating, blood in the urine, back pain -trouble passing urine or change in the amount of urine, including an urgent need to urinate more often, in larger amounts, or at night -penile discharge, itching, or pain in men -slow, irregular heartbeat -unusually tired or weak -vaginal discharge, itching, or odor in women Side effects that usually do not require medical attention (Report these to your doctor or health care professional if they continue or are bothersome.): -constipation -diarrhea -headache -heartburn -mild increase in urination -stomach gas -thirsty This list may not describe all possible side effects. Call your doctor for medical advice about side effects. You may report side effects to FDA at 1-800-FDA-1088. Where should I keep my medicine? Keep out of the reach of children. Store at room temperature between 15 and 30 degrees C (59 and 86 degrees F). Throw away any unused medicine  after the expiration date. NOTE: This sheet is a summary. It may not cover all possible information. If you have questions about this  medicine, talk to your doctor, pharmacist, or health care provider.    2016, Elsevier/[employer]. (2015-02-16 14:34:04)

## 2015-08-16 NOTE — Assessment & Plan Note (Signed)
Currently asymptomatic on OCP. Phenergan prescribed prn nausea. She as instructed to d/c OCP so she can hopefully get pregnant. She might start having withdrawal bleeding once she d/c OCP. F/U as needed for now.

## 2015-08-16 NOTE — Progress Notes (Signed)
Subjective:     Patient ID: Kaitlyn Espinoza, female   DOB: 06/04/1991, 24 y.o.   MRN: [id number]  HPI PCOS/Fertility: Here for follow up. She is still taking her birth control and her period has stopped for more than 3 wks. Patient stated she will like to get pregnant with her boy friend and has been trying for months with no success. She think this is due to her PCOS and will like to get help with fertility. She mentioned she has nausea at times while taking her birth control and will like to get something for that. No other concern. Menorrhagia: Currently her bleeding stopped and she is still on OCP. She denies belly pain. Feels nauseous with her OCP.  Current Outpatient Prescriptions on File Prior to Visit  Medication Sig Dispense Refill  . desogestrel-ethinyl estradiol (APRI,EMOQUETTE,SOLIA) 0.15-30 MG-MCG tablet Take 1 tablet by mouth daily. 1 Package 6  . albuterol (PROVENTIL HFA) 108 (90 BASE) MCG/ACT inhaler Inhale 2 puffs into the lungs every 6 (six) hours as needed for wheezing. 1 Inhaler 0   Current Facility-Administered Medications on File Prior to Visit  Medication Dose Route Frequency Provider Last Rate Last Dose  . tuberculin injection 5 Units  5 Units Intradermal Once Macy MisKim K Briscoe, MD       Past Medical History  Diagnosis Date  . Asthma      Review of Systems  Respiratory: Negative.   Cardiovascular: Negative.   Genitourinary: Positive for menstrual problem. Negative for hematuria, vaginal bleeding, vaginal discharge and vaginal pain.  All other systems reviewed and are negative.  Filed Vitals:   08/16/15 0933  BP: 113/65  Pulse: 79  Temp: 98.3 F (36.8 C)  TempSrc: Oral  Height: 5\' 6"  (1.676 m)  Weight: 165 lb 7 oz (75.042 kg)       Objective:   Physical Exam  Constitutional: She is oriented to person, place, and time. She appears well-developed. No distress.  Cardiovascular: Normal rate, regular rhythm and normal heart sounds.   No murmur  heard. Pulmonary/Chest: Effort normal and breath sounds normal. No respiratory distress. She has no wheezes.  Abdominal: Soft. Bowel sounds are normal. She exhibits no distension and no mass. There is no tenderness.  Musculoskeletal: Normal range of motion. She exhibits no edema.  Neurological: She is alert and oriented to person, place, and time. No cranial nerve deficit.  Nursing note and vitals reviewed.      Assessment:     PCOS  Infertility Menorrhagia     Plan:     Check problem list.

## 2015-08-16 NOTE — Assessment & Plan Note (Signed)
She will like to get pregnant. She is advised to stop her OCP. She was started on Metformin for ovulation induction. Continue to maintain weight. F/U as needed.

## 2015-08-16 NOTE — Assessment & Plan Note (Signed)
Likely related to her PCOS. Patient advised to d/c birth control now that she has stopped bleeding. Urine pregnancy test done today was negative. I started her on Metformin to induce ovulation. She is advised to d/c meds once she is pregnant. I will see her back in 4 wks.

## 2015-10-11 ENCOUNTER — Ambulatory Visit (INDEPENDENT_AMBULATORY_CARE_PROVIDER_SITE_OTHER): Payer: Self-pay | Admitting: Family Medicine

## 2015-10-11 ENCOUNTER — Encounter: Payer: Self-pay | Admitting: Family Medicine

## 2015-10-11 VITALS — BP 100/60 | HR 71 | Temp 98.3°F | Ht 66.0 in | Wt 163.0 lb

## 2015-10-11 DIAGNOSIS — E282 Polycystic ovarian syndrome: Secondary | ICD-10-CM

## 2015-10-11 DIAGNOSIS — N946 Dysmenorrhea, unspecified: Secondary | ICD-10-CM | POA: Insufficient documentation

## 2015-10-11 NOTE — Patient Instructions (Signed)
Dysmenorrhea Dysmenorrhea is pain during a menstrual period. You will have pain in the lower belly (abdomen). The pain is caused by the tightening (contracting) of the muscles of the uterus. The pain can be minor or severe. Headache, feeling sick to your stomach (nausea), throwing up (vomiting), or low back pain may occur with this condition. HOME CARE  Only take medicine as told by your doctor.  Place a heating pad or hot water bottle on your lower back or belly. Do not sleep with a heating pad.  Exercise may help lessen the pain.  Massage the lower back or belly.  Stop smoking.  Avoid alcohol and caffeine. GET HELP IF:   Your pain does not get better with medicine.  You have pain during sex.  Your pain gets worse while taking pain medicine.  Your period bleeding is heavier than normal.  You keep feeling sick to your stomach or keep throwing up. GET HELP RIGHT AWAY IF: You pass out (faint).   This information is not intended to replace advice given to you by your health care provider. Make sure you discuss any questions you have with your health care provider.   Document Released: 12/14/2008 Document Revised: 09/22/2013 Document Reviewed: 03/05/2013 Elsevier Interactive Patient Education 2016 [employer]  

## 2015-10-11 NOTE — Assessment & Plan Note (Signed)
Continue current dose of Metformin for now. She just got off her period 2 days ago hence unlikely pregnant at this time although pregnancy test was offered but she declined. Plan to f/u in 3 months or sooner if she misses her period.

## 2015-10-11 NOTE — Assessment & Plan Note (Signed)
Likely primary dysmenorrhea with worsening symptoms x 1 episode. ?? Related to hormonal imbalance given she that she recently completed a course of high dose OCP for menorrhagia. I recommended Ibuprofen prn pain with menses. F/U as needed.

## 2015-10-11 NOTE — Progress Notes (Signed)
Subjective:     Patient ID: Kaitlyn Espinoza, female   DOB: 1991/08/12, 25 y.o.   MRN: [id number]  HPI PCOS: here for follow up, she is currently on Metformin 500 mg BID for ovulation induction which she started the last week in Nov. LMP prior to starting Metformin was in Nov. She did not get her period in Dec, and then she had one Jan 1st lasted for 7 days. The first two were heavy associated with cramping and pain.  Dysmenorrhea:LMP was 10/02/15 with initial heaviness but it lightened up by day 6-8. She is concern she had worsened cramps as well as pain during her last period which is unusual for her. She usually uses Tylenol for pain during her period but it was not effective hence she used Ibuprofen. She denies any concern today.  Current Outpatient Prescriptions on File Prior to Visit  Medication Sig Dispense Refill  . albuterol (PROVENTIL HFA) 108 (90 BASE) MCG/ACT inhaler Inhale 2 puffs into the lungs every 6 (six) hours as needed for wheezing. 1 Inhaler 0  . desogestrel-ethinyl estradiol (APRI,EMOQUETTE,SOLIA) 0.15-30 MG-MCG tablet Take 1 tablet by mouth daily. 1 Package 6  . metFORMIN (GLUCOPHAGE) 500 MG tablet Take 1 tablet (500 mg total) by mouth 2 (two) times daily with a meal. 60 tablet 1  . promethazine (PHENERGAN) 12.5 MG tablet Take 1 tablet (12.5 mg total) by mouth every 6 (six) hours as needed for nausea or vomiting. 30 tablet 0   Current Facility-Administered Medications on File Prior to Visit  Medication Dose Route Frequency Provider Last Rate Last Dose  . tuberculin injection 5 Units  5 Units Intradermal Once Macy MisKim K Briscoe, MD       Past Medical History  Diagnosis Date  . Asthma      Review of Systems  Respiratory: Negative.   Cardiovascular: Negative.   Gastrointestinal: Negative.   Genitourinary: Positive for menstrual problem. Negative for dysuria, vaginal bleeding, vaginal discharge and pelvic pain.       Infertility  All other systems reviewed and are  negative.  Filed Vitals:   10/11/15 0852  BP: 100/60  Pulse: 71  Temp: 98.3 F (36.8 C)  TempSrc: Oral  Height: 5\' 6"  (1.676 m)  Weight: 163 lb (73.936 kg)  SpO2: 98%       Objective:   Physical Exam  Constitutional: She appears well-developed. No distress.  Cardiovascular: Normal rate, regular rhythm, normal heart sounds and intact distal pulses.   No murmur heard. Pulmonary/Chest: Effort normal and breath sounds normal. No respiratory distress. She has no wheezes.  Abdominal: Soft. Bowel sounds are normal. She exhibits no distension and no mass. There is no tenderness.  Genitourinary:  Deferred  Musculoskeletal: Normal range of motion. She exhibits no edema.  Nursing note and vitals reviewed.      Assessment:     PCOS Dysmenorrhagia    Plan:     Check problem

## 2015-12-23 ENCOUNTER — Encounter (HOSPITAL_COMMUNITY): Payer: Self-pay | Admitting: Emergency Medicine

## 2015-12-23 ENCOUNTER — Emergency Department (HOSPITAL_COMMUNITY): Payer: 59

## 2015-12-23 ENCOUNTER — Emergency Department (HOSPITAL_COMMUNITY)
Admission: EM | Admit: 2015-12-23 | Discharge: 2015-12-23 | Disposition: A | Discharge status: Home / Self Care | Payer: 59 | Attending: Emergency Medicine | Admitting: Emergency Medicine

## 2015-12-23 DIAGNOSIS — O99511 Diseases of the respiratory system complicating pregnancy, first trimester: Secondary | ICD-10-CM | POA: Diagnosis not present

## 2015-12-23 DIAGNOSIS — Z8742 Personal history of other diseases of the female genital tract: Secondary | ICD-10-CM | POA: Insufficient documentation

## 2015-12-23 DIAGNOSIS — J45909 Unspecified asthma, uncomplicated: Secondary | ICD-10-CM | POA: Insufficient documentation

## 2015-12-23 DIAGNOSIS — Z3A01 Less than 8 weeks gestation of pregnancy: Secondary | ICD-10-CM | POA: Diagnosis not present

## 2015-12-23 DIAGNOSIS — O200 Threatened abortion: Secondary | ICD-10-CM | POA: Diagnosis not present

## 2015-12-23 DIAGNOSIS — Z79899 Other long term (current) drug therapy: Secondary | ICD-10-CM | POA: Insufficient documentation

## 2015-12-23 DIAGNOSIS — R52 Pain, unspecified: Secondary | ICD-10-CM

## 2015-12-23 DIAGNOSIS — O9989 Other specified diseases and conditions complicating pregnancy, childbirth and the puerperium: Secondary | ICD-10-CM | POA: Diagnosis present

## 2015-12-23 HISTORY — DX: Unspecified ovarian cyst, unspecified side: N83.209

## 2015-12-23 LAB — COMPREHENSIVE METABOLIC PANEL
ALBUMIN: 4.2 g/dL (ref 3.5–5.0)
ALT: 10 U/L — ABNORMAL LOW (ref 14–54)
AST: 12 U/L — AB (ref 15–41)
Alkaline Phosphatase: 59 U/L (ref 38–126)
Anion gap: 8 (ref 5–15)
BUN: 13 mg/dL (ref 6–20)
CHLORIDE: 107 mmol/L (ref 101–111)
CO2: 23 mmol/L (ref 22–32)
Calcium: 9 mg/dL (ref 8.9–10.3)
Creatinine, Ser: 0.57 mg/dL (ref 0.44–1.00)
GFR calc Af Amer: >60 mL/min (ref >60)
GLUCOSE: 89 mg/dL (ref 65–99)
POTASSIUM: 3.9 mmol/L (ref 3.5–5.1)
Sodium: 138 mmol/L (ref 135–145)
Total Bilirubin: 0.4 mg/dL (ref 0.3–1.2)
Total Protein: 7.5 g/dL (ref 6.5–8.1)

## 2015-12-23 LAB — CBC
HEMATOCRIT: 38.7 % (ref 36.0–46.0)
Hemoglobin: 12.9 g/dL (ref 12.0–15.0)
MCH: 27.4 pg (ref 26.0–34.0)
MCHC: 33.3 g/dL (ref 30.0–36.0)
MCV: 82.3 fL (ref 78.0–100.0)
Platelets: 151 10*3/uL (ref 150–400)
RBC: 4.7 MIL/uL (ref 3.87–5.11)
RDW: 14.2 % (ref 11.5–15.5)
WBC: 8 10*3/uL (ref 4.0–10.5)

## 2015-12-23 LAB — I-STAT BETA HCG BLOOD, ED (MC, WL, AP ONLY): HCG, QUANTITATIVE: 148.8 m[IU]/mL — AB (ref <5)

## 2015-12-23 LAB — URINALYSIS, ROUTINE W REFLEX MICROSCOPIC
Bilirubin Urine: NEGATIVE
GLUCOSE, UA: NEGATIVE mg/dL
Hgb urine dipstick: NEGATIVE
KETONES UR: NEGATIVE mg/dL
LEUKOCYTES UA: NEGATIVE
Nitrite: NEGATIVE
PH: 6.5 (ref 5.0–8.0)
PROTEIN: NEGATIVE mg/dL
SPECIFIC GRAVITY, URINE: 1.025 (ref 1.005–1.030)

## 2015-12-23 LAB — WET PREP, GENITAL
Clue Cells Wet Prep HPF POC: NONE SEEN
Sperm: NONE SEEN
Trich, Wet Prep: NONE SEEN
YEAST WET PREP: NONE SEEN

## 2015-12-23 LAB — LIPASE, BLOOD: LIPASE: 27 U/L (ref 11–51)

## 2015-12-23 NOTE — ED Provider Notes (Signed)
CSN: [id number]     Arrival date & time 12/23/15  0867 History   First MD Initiated Contact with Patient 12/23/15 1135     Chief Complaint  Patient presents with  . Abdominal Cramping     (Consider location/radiation/quality/duration/timing/severity/associated sxs/prior Treatment) HPI Comments: Patient presents with lower abdominal cramping. She is G0 P0. She states that over the last 2-3 days she's had worsening cramping across her lower abdomen. It radiates to her low back. She's had a small amount of white discharge. She states her last menstrual period was February 5. She's taken 2 home pregnancy test that it been negative. She states that she is trying to get pregnant. She recently saw an OB/GYN and had an ultrasound which showed ovarian cyst. She denies any nausea or vomiting. No fevers or chills. No urinary symptoms. She's been taking Tylenol with some relief in symptoms.  Patient is a 25 y.o. female presenting with cramps.  Abdominal Cramping Pertinent negatives include no chest pain, no abdominal pain, no headaches and no shortness of breath.    Past Medical History  Diagnosis Date  . Asthma   . Ovarian cyst    History reviewed. No pertinent past surgical history. Family History  Problem Relation Age of Onset  . Alcohol abuse Father   . Hyperlipidemia Father   . Diabetes Paternal Grandmother    Social History  Substance Use Topics  . Smoking status: Never Smoker   . Smokeless tobacco: None  . Alcohol Use: No   OB History    No data available     Review of Systems  Constitutional: Negative for fever, chills, diaphoresis and fatigue.  HENT: Negative for congestion, rhinorrhea and sneezing.   Eyes: Negative.   Respiratory: Negative for cough, chest tightness and shortness of breath.   Cardiovascular: Negative for chest pain and leg swelling.  Gastrointestinal: Negative for nausea, vomiting, abdominal pain, diarrhea and blood in stool.  Genitourinary: Positive for  vaginal discharge and pelvic pain. Negative for frequency, hematuria, flank pain, vaginal bleeding and difficulty urinating.  Musculoskeletal: Negative for back pain and arthralgias.  Skin: Negative for rash.  Neurological: Negative for dizziness, speech difficulty, weakness, numbness and headaches.      Allergies  Review of patient's allergies indicates no known allergies.  Home Medications   Prior to Admission medications   Medication Sig Start Date End Date Taking? Authorizing Provider  acetaminophen (TYLENOL) 500 MG tablet Take 1,000 mg by mouth every 6 (six) hours as needed for moderate pain.   Yes Historical Provider, MD  albuterol (PROVENTIL HFA) 108 (90 BASE) MCG/ACT inhaler Inhale 2 puffs into the lungs every 6 (six) hours as needed for wheezing. 04/22/12 04/22/13  Macy Mis, MD  metFORMIN (GLUCOPHAGE) 500 MG tablet Take 1 tablet (500 mg total) by mouth 2 (two) times daily with a meal. Patient not taking: Reported on 12/23/2015 08/16/15   Doreene Eland, MD   BP 108/58 mmHg  Pulse 93  Temp(Src) 98.1 F (36.7 C) (Oral)  Resp 16  SpO2 100% Physical Exam  Constitutional: She is oriented to person, place, and time. She appears well-developed and well-nourished.  HENT:  Head: Normocephalic and atraumatic.  Eyes: Pupils are equal, round, and reactive to light.  Neck: Normal range of motion. Neck supple.  Cardiovascular: Normal rate, regular rhythm and normal heart sounds.   Pulmonary/Chest: Effort normal and breath sounds normal. No respiratory distress. She has no wheezes. She has no rales. She exhibits no tenderness.  Abdominal: Soft.  Bowel sounds are normal. There is no tenderness. There is no rebound and no guarding.  Genitourinary:  Scant white vaginal discharge. Mild generalized tenderness but no specific cervical motion tenderness or unilateral adnexal tenderness. No bleeding  Musculoskeletal: Normal range of motion. She exhibits no edema.  Lymphadenopathy:    She  has no cervical adenopathy.  Neurological: She is alert and oriented to person, place, and time.  Skin: Skin is warm and dry. No rash noted.  Psychiatric: She has a normal mood and affect.    ED Course  Procedures (including critical care time) Labs Review Results for orders placed or performed during the hospital encounter of 12/23/15  Wet prep, genital  Result Value Ref Range   Yeast Wet Prep HPF POC NONE SEEN NONE SEEN   Trich, Wet Prep NONE SEEN NONE SEEN   Clue Cells Wet Prep HPF POC NONE SEEN NONE SEEN   WBC, Wet Prep HPF POC MODERATE (A) NONE SEEN   Sperm NONE SEEN   Lipase, blood  Result Value Ref Range   Lipase 27 11 - 51 U/L  Comprehensive metabolic panel  Result Value Ref Range   Sodium 138 135 - 145 mmol/L   Potassium 3.9 3.5 - 5.1 mmol/L   Chloride 107 101 - 111 mmol/L   CO2 23 22 - 32 mmol/L   Glucose, Bld 89 65 - 99 mg/dL   BUN 13 6 - 20 mg/dL   Creatinine, Ser 1.610.57 0.44 - 1.00 mg/dL   Calcium 9.0 8.9 - 09.610.3 mg/dL   Total Protein 7.5 6.5 - 8.1 g/dL   Albumin 4.2 3.5 - 5.0 g/dL   AST 12 (L) 15 - 41 U/L   ALT 10 (L) 14 - 54 U/L   Alkaline Phosphatase 59 38 - 126 U/L   Total Bilirubin 0.4 0.3 - 1.2 mg/dL   GFR calc non Af Amer >60 >60 mL/min   GFR calc Af Amer >60 >60 mL/min   Anion gap 8 5 - 15  CBC  Result Value Ref Range   WBC 8.0 4.0 - 10.5 K/uL   RBC 4.70 3.87 - 5.11 MIL/uL   Hemoglobin 12.9 12.0 - 15.0 g/dL   HCT 04.538.7 40.936.0 - 81.146.0 %   MCV 82.3 78.0 - 100.0 fL   MCH 27.4 26.0 - 34.0 pg   MCHC 33.3 30.0 - 36.0 g/dL   RDW 91.414.2 78.211.5 - 95.615.5 %   Platelets 151 150 - 400 K/uL  Urinalysis, Routine w reflex microscopic (not [facility])  Result Value Ref Range   Color, Urine YELLOW YELLOW   APPearance CLEAR CLEAR   Specific Gravity, Urine 1.025 1.005 - 1.030   pH 6.5 5.0 - 8.0   Glucose, UA NEGATIVE NEGATIVE mg/dL   Hgb urine dipstick NEGATIVE NEGATIVE   Bilirubin Urine NEGATIVE NEGATIVE   Ketones, ur NEGATIVE NEGATIVE mg/dL   Protein, ur NEGATIVE  NEGATIVE mg/dL   Nitrite NEGATIVE NEGATIVE   Leukocytes, UA NEGATIVE NEGATIVE  I-Stat beta hCG blood, ED ([facility], [facility], [facility] only)  Result Value Ref Range   I-stat hCG, quantitative 148.8 (H) <5 mIU/mL   Comment 3           [place] Ob Comp Less 14 Wks  12/23/2015  CLINICAL DATA:  Pelvic pain for 4 days. EXAM: OBSTETRIC <14 WK US AND TRANSVAGINAL OB US TECHNIQUE: Both transabdominal and transvaginal ultrasound examinations were performed for complete evaluation of the gestation as well as the maternal uterus, adnexal regions, and pelvic cul-de-sac. Transvaginal  technique was performed to assess early pregnancy. COMPARISON:  Pelvic ultrasound 07/15/2015 FINDINGS: Intrauterine gestational sac: Suspected early gestational sac at the superior aspect of the endometrial canal. Yolk sac:  Not visualized Embryo:  Not visualized MSD: 3.9  mm   5 w   1  d Subchorionic hemorrhage:  None visualized. Maternal uterus/adnexae: Small volume free fluid. Unremarkable appearance of the ovaries. IMPRESSION: Probable early intrauterine gestational sac, but no yolk sac, fetal pole, or cardiac activity yet visualized. Recommend follow-up quantitative B-HCG levels and follow-up US in 14 days to confirm and assess viability. This recommendation follows SRU consensus guidelines: Diagnostic Criteria for Nonviable Pregnancy Early in the First Trimester. Malva Limes Med 2013; 161:0960-45. Electronically Signed   By: Sebastian Ache M.D.   On: 12/23/2015 12:53   US Ob Transvaginal  12/23/2015  CLINICAL DATA:  Pelvic pain for 4 days. EXAM: OBSTETRIC <14 WK [place] AND TRANSVAGINAL OB US TECHNIQUE: Both transabdominal and transvaginal ultrasound examinations were performed for complete evaluation of the gestation as well as the maternal uterus, adnexal regions, and pelvic cul-de-sac. Transvaginal technique was performed to assess early pregnancy. COMPARISON:  Pelvic ultrasound 07/15/2015 FINDINGS: Intrauterine gestational sac: Suspected early gestational sac  at the superior aspect of the endometrial canal. Yolk sac:  Not visualized Embryo:  Not visualized MSD: 3.9  mm   5 w   1  d Subchorionic hemorrhage:  None visualized. Maternal uterus/adnexae: Small volume free fluid. Unremarkable appearance of the ovaries. IMPRESSION: Probable early intrauterine gestational sac, but no yolk sac, fetal pole, or cardiac activity yet visualized. Recommend follow-up quantitative B-HCG levels and follow-up US in 14 days to confirm and assess viability. This recommendation follows SRU consensus guidelines: Diagnostic Criteria for Nonviable Pregnancy Early in the First Trimester. Malva Limes Med 2013; 409:8119-14. Electronically Signed   By: Sebastian Ache M.D.   On: 12/23/2015 12:53      Imaging Review US Ob Comp Less 14 Wks  12/23/2015  CLINICAL DATA:  Pelvic pain for 4 days. EXAM: OBSTETRIC <14 WK [place] AND TRANSVAGINAL OB US TECHNIQUE: Both transabdominal and transvaginal ultrasound examinations were performed for complete evaluation of the gestation as well as the maternal uterus, adnexal regions, and pelvic cul-de-sac. Transvaginal technique was performed to assess early pregnancy. COMPARISON:  Pelvic ultrasound 07/15/2015 FINDINGS: Intrauterine gestational sac: Suspected early gestational sac at the superior aspect of the endometrial canal. Yolk sac:  Not visualized Embryo:  Not visualized MSD: 3.9  mm   5 w   1  d Subchorionic hemorrhage:  None visualized. Maternal uterus/adnexae: Small volume free fluid. Unremarkable appearance of the ovaries. IMPRESSION: Probable early intrauterine gestational sac, but no yolk sac, fetal pole, or cardiac activity yet visualized. Recommend follow-up quantitative B-HCG levels and follow-up US in 14 days to confirm and assess viability. This recommendation follows SRU consensus guidelines: Diagnostic Criteria for Nonviable Pregnancy Early in the First Trimester. Malva Limes Med 2013; 782:9562-13. Electronically Signed   By: Sebastian Ache M.D.   On:  12/23/2015 12:53   US Ob Transvaginal  12/23/2015  CLINICAL DATA:  Pelvic pain for 4 days. EXAM: OBSTETRIC <14 WK [place] AND TRANSVAGINAL OB US TECHNIQUE: Both transabdominal and transvaginal ultrasound examinations were performed for complete evaluation of the gestation as well as the maternal uterus, adnexal regions, and pelvic cul-de-sac. Transvaginal technique was performed to assess early pregnancy. COMPARISON:  Pelvic ultrasound 07/15/2015 FINDINGS: Intrauterine gestational sac: Suspected early gestational sac at the superior aspect of the endometrial canal. Yolk  sac:  Not visualized Embryo:  Not visualized MSD: 3.9  mm   5 w   1  d Subchorionic hemorrhage:  None visualized. Maternal uterus/adnexae: Small volume free fluid. Unremarkable appearance of the ovaries. IMPRESSION: Probable early intrauterine gestational sac, but no yolk sac, fetal pole, or cardiac activity yet visualized. Recommend follow-up quantitative B-HCG levels and follow-up US in 14 days to confirm and assess viability. This recommendation follows SRU consensus guidelines: Diagnostic Criteria for Nonviable Pregnancy Early in the First Trimester. Malva Limes Med 2013; 119:1478-29. Electronically Signed   By: Sebastian Ache M.D.   On: 12/23/2015 12:53   I have personally reviewed and evaluated these images and lab results as part of my medical decision-making.   EKG Interpretation None      MDM   Final diagnoses:  Threatened abortion    Patient presents with lower abdominal pain and association with early pregnancy. Her Quant hCG is 148. Pelvic ultrasound shows a probable intrauterine sac but no fetal pole noted. She doesn't have significant discharge or other suggestions of PID.  She's otherwise well-appearing with no significant unilateral tenderness or vaginal bleeding. She has an OB/GYN at [facility] in [place]. I encouraged her to follow-up there within the next week for recheck and repeat Quant levels. She was given  strict precautions to return if she has worsening pain or heavy vaginal bleeding. She was advised that this occurs she needs to go to the [facility].    Rolan Bucco, MD 12/23/15 [phone]

## 2015-12-23 NOTE — Discharge Instructions (Signed)

## 2015-12-23 NOTE — ED Notes (Addendum)
Pt reports lower abd cramping that radiates to lower back since Tuesday. Feels like menstrual cramps, but has not had any bleeding. LMP in early February. No diarrhea. Pt reports regular cream discharge.

## 2015-12-26 LAB — GC/CHLAMYDIA PROBE AMP (~~LOC~~) NOT AT ARMC
Chlamydia: NEGATIVE
Neisseria Gonorrhea: NEGATIVE

## 2017-07-29 DIAGNOSIS — R42 Dizziness and giddiness: Secondary | ICD-10-CM | POA: Diagnosis not present

## 2018-03-11 ENCOUNTER — Ambulatory Visit (INDEPENDENT_AMBULATORY_CARE_PROVIDER_SITE_OTHER): Payer: 59 | Admitting: Family Medicine

## 2018-03-11 ENCOUNTER — Encounter

## 2018-03-11 ENCOUNTER — Encounter: Payer: Self-pay | Admitting: Family Medicine

## 2018-03-11 VITALS — BP 122/83 | HR 64 | Temp 98.2°F | Resp 12 | Ht 66.0 in | Wt 161.4 lb

## 2018-03-11 DIAGNOSIS — N946 Dysmenorrhea, unspecified: Secondary | ICD-10-CM | POA: Diagnosis not present

## 2018-03-11 DIAGNOSIS — N926 Irregular menstruation, unspecified: Secondary | ICD-10-CM

## 2018-03-11 DIAGNOSIS — E282 Polycystic ovarian syndrome: Secondary | ICD-10-CM | POA: Diagnosis not present

## 2018-03-11 DIAGNOSIS — J45990 Exercise induced bronchospasm: Secondary | ICD-10-CM

## 2018-03-11 DIAGNOSIS — R1030 Lower abdominal pain, unspecified: Secondary | ICD-10-CM | POA: Insufficient documentation

## 2018-03-11 DIAGNOSIS — Z30011 Encounter for initial prescription of contraceptive pills: Secondary | ICD-10-CM

## 2018-03-11 LAB — POCT URINE PREGNANCY: PREG TEST UR: NEGATIVE

## 2018-03-11 MED ORDER — ALBUTEROL SULFATE HFA 108 (90 BASE) MCG/ACT IN AERS: 2.0000 | INHALATION_SPRAY | Freq: Four times a day (QID) | RESPIRATORY_TRACT | 2 refills | Status: AC | PRN

## 2018-03-11 MED ORDER — NORETHIN ACE-ETH ESTRAD-FE 1-20 MG-MCG PO TABS: 1.0000 | ORAL_TABLET | Freq: Every day | ORAL | 0 refills | Status: DC

## 2018-03-11 NOTE — Patient Instructions (Addendum)
A few things to remember from today's visit:   Missed menses - Plan: POCT urine pregnancy  Dysmenorrhea - Plan: norethindrone-ethinyl estradiol (LOESTRIN FE 1/20) 1-20 MG-MCG tablet  Encounter for initial prescription of contraceptive pills - Plan: norethindrone-ethinyl estradiol (LOESTRIN FE 1/20) 1-20 MG-MCG tablet  Exercise-induced asthma  PCOS (polycystic ovarian syndrome)   Please be sure medication list is accurate. If a new problem present, please set up appointment sooner than planned today.

## 2018-03-11 NOTE — Progress Notes (Signed)
HPI:   Kaitlyn Espinoza is a 27 y.o. female, who is here today to establish care.  Former PCP: Dr Janit Pagan Last preventive routine visit: 10/2016  Chronic medical problems: Irregular menses, dysmenorrhea,allergic rhinitis, and asthma.  Asthma: She is currently on Albuterol inhaler as needed. Exacerbated by exercise. Currently asymptomatic.   Concerns today: Birth control.  She has been on Depo-Provera, she does not remember when her last dose was.  She was on Depo-Provera for about 5 years. History of irregular menstrual periods, oligomenorrhea.  She states that she has been told in the past that she has PCOS She has taking Loestrin in the past and tolerated well. She is sexually active. Denies risk factors for STDs. Last sex intercourse was yesterday, she is now using condoms. G:1 M: 11 A:0 L:1  Pelvic/transvaginal US 07/2015: 1. Normal anteverted uterus. No uterine fibroids. No endometrial abnormality. 2. Symmetrically enlarged ovaries with increased immature peripheral follicles, meeting sonographic criteria for polycystic ovarian syndrome in the correct clinical setting. No ovarian or adnexal masses.   Dysmenorrhea, she has pelvic pain, cramps for the first 2 days of menstrual cycle.  She mentions long history of low abdominal pain (right below umbilicus), cramps, usually last 2 to 3 days. She denies constipation. Abdominal pain is not alleviated by defecation. She has not identified exacerbating or alleviating factors. It has been stable.   Review of Systems  Constitutional: Negative for activity change, appetite change, fatigue and fever.  HENT: Negative for mouth sores, nosebleeds and trouble swallowing.   Eyes: Negative for redness and visual disturbance.  Respiratory: Negative for cough, shortness of breath and wheezing.   Cardiovascular: Negative for chest pain, palpitations and leg swelling.  Gastrointestinal: Positive for abdominal pain  (occasionally.). Negative for blood in stool, nausea and vomiting.       Negative for changes in bowel habits.  Endocrine: Negative for cold intolerance and heat intolerance.  Genitourinary: Positive for menstrual problem. Negative for decreased urine volume, difficulty urinating, dysuria, hematuria, vaginal bleeding and vaginal discharge.  Neurological: Negative for syncope, weakness and headaches.      Current Outpatient Medications on File Prior to Visit  Medication Sig Dispense Refill  . acetaminophen (TYLENOL) 500 MG tablet Take 1,000 mg by mouth every 6 (six) hours as needed for moderate pain.     Current Facility-Administered Medications on File Prior to Visit  Medication Dose Route Frequency Provider Last Rate Last Dose  . tuberculin injection 5 Units  5 Units Intradermal Once Macy Mis, MD         Past Medical History:  Diagnosis Date  . Asthma   . Ovarian cyst    No Known Allergies  Family History  Problem Relation Age of Onset  . Alcohol abuse Father   . Hyperlipidemia Father   . Diabetes Paternal Grandmother     Social History   Socioeconomic History  . Marital status: Single    Spouse name: Not on file  . Number of children: 1  . Years of education: Not on file  . Highest education level: Bachelor's degree (e.g., BA, AB, BS)  Occupational History  . Not on file  Social Needs  . Financial resource strain: Not on file  . Food insecurity:    Worry: Not on file    Inability: Not on file  . Transportation needs:    Medical: Not on file    Non-medical: Not on file  Tobacco Use  . Smoking status: Never  Smoker  . Smokeless tobacco: Never Used  Substance and Sexual Activity  . Alcohol use: No  . Drug use: No  . Sexual activity: Yes    Partners: Male    Birth control/protection: Injection  Lifestyle  . Physical activity:    Days per week: Not on file    Minutes per session: Not on file  . Stress: Not on file  Relationships  . Social  connections:    Talks on phone: Not on file    Gets together: Not on file    Attends religious service: Not on file    Active member of club or organization: Not on file    Attends meetings of clubs or organizations: Not on file    Relationship status: Not on file  Other Topics Concern  . Not on file  Social History Narrative   Lives alone.  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    Vitals:   03/11/18 1528  BP: 122/83  Pulse: 64  Resp: 12  Temp: 98.2 F (36.8 C)  SpO2: 100%    Body mass index is 26.05 kg/m.   Physical Exam  Nursing note and vitals reviewed. Constitutional: She is oriented to person, place, and time. She appears well-developed and well-nourished. No distress.  HENT:  Head: Normocephalic and atraumatic.  Mouth/Throat: Oropharynx is clear and moist and mucous membranes are normal.  Eyes: Pupils are equal, round, and reactive to light. Conjunctivae and EOM are normal.  Neck: No tracheal deviation present. No thyroid mass present.  Cardiovascular: Normal rate and regular rhythm.  No murmur heard. Pulses:      Dorsalis pedis pulses are 2+ on the right side, and 2+ on the left side.  Respiratory: Effort normal and breath sounds normal. No respiratory distress.  GI: Soft. She exhibits no mass. There is no hepatomegaly. There is no tenderness.  Musculoskeletal: She exhibits no edema.  Lymphadenopathy:    She has no cervical adenopathy.  Neurological: She is alert and oriented to person, place, and time. She has normal strength. Coordination normal.  Skin: Skin is warm. No erythema.  Psychiatric: She has a normal mood and affect.  Well groomed, good eye contact.    ASSESSMENT AND PLAN:   Kaitlyn Espinoza was seen today for establish care.  Diagnoses and all orders for this visit:  Missed menses  Urine pregnancy here in the office negative. Most likely related to her history of PCOS as well as years of Depo-Provera. Instructed to repeat pregnancy test in 2 weeks.  -      POCT urine pregnancy  Dysmenorrhea  She has tolerated Loestrin FE well in the past, so she will start same OCP.  -     norethindrone-ethinyl estradiol (LOESTRIN FE 1/20) 1-20 MG-MCG tablet; Take 1 tablet by mouth daily.  Encounter for initial prescription of contraceptive pills  She would like to try OCPs. We discussed side effects of OCPs, including thrombotic events, liver abnormalities, mood changes, and GI symptoms among some. She was instructed to start OCPs after a second negative pregnancy test, which she will repeat in 2 weeks. Recommend using condoms for birth control in the meantime. Instructed to let me know in 3 months about tolerance, if she tolerates medication well and it helps well-regulated menstrual cycles she can continue following annually.  -     norethindrone-ethinyl estradiol (LOESTRIN FE 1/20) 1-20 MG-MCG tablet; Take 1 tablet by mouth daily.  Exercise-induced asthma  Stable. No changes in current management. Follow-up in  1 year.  -     albuterol (PROVENTIL HFA) 108 (90 Base) MCG/ACT inhaler; Inhale 2 puffs into the lungs every 6 (six) hours as needed for wheezing.  PCOS (polycystic ovarian syndrome)  We discussed diagnostic criteria. Transvaginal US reviewed.  -     norethindrone-ethinyl estradiol (LOESTRIN FE 1/20) 1-20 MG-MCG tablet; Take 1 tablet by mouth daily.   Lower abdominal pain  Chronic. Abdominal exam today negative. ? IBS Instructed about warning signs.     Alexys Lobello G. [place], MD  [facility]. [place] office.

## 2018-03-12 ENCOUNTER — Telehealth: Payer: Self-pay | Admitting: Family Medicine

## 2018-03-12 NOTE — Telephone Encounter (Signed)
Spoke with patient and informed of lab results. Patient verbalized understanding.

## 2018-03-12 NOTE — Telephone Encounter (Signed)
Copied from CRM [id number]. Topic: Quick Communication - See Telephone Encounter >> Mar 12, 2018  1:06 PM Arlyss Gandyichardson, Man Effertz N, NT wrote: CRM for notification. See Telephone encounter for: 03/12/18. Pt would like a call to discuss her lab results.

## 2018-03-17 ENCOUNTER — Ambulatory Visit: Payer: 59 | Admitting: Family Medicine

## 2018-04-15 ENCOUNTER — Encounter (HOSPITAL_COMMUNITY): Payer: Self-pay

## 2018-04-15 ENCOUNTER — Other Ambulatory Visit (HOSPITAL_COMMUNITY): Payer: 59

## 2018-04-15 ENCOUNTER — Other Ambulatory Visit: Payer: Self-pay

## 2018-04-15 ENCOUNTER — Emergency Department (HOSPITAL_COMMUNITY): Payer: 59

## 2018-04-15 ENCOUNTER — Emergency Department (HOSPITAL_COMMUNITY)
Admission: EM | Admit: 2018-04-15 | Discharge: 2018-04-15 | Disposition: A | Discharge status: Home / Self Care | Payer: 59 | Attending: Emergency Medicine | Admitting: Emergency Medicine

## 2018-04-15 DIAGNOSIS — R110 Nausea: Secondary | ICD-10-CM | POA: Diagnosis not present

## 2018-04-15 DIAGNOSIS — J45909 Unspecified asthma, uncomplicated: Secondary | ICD-10-CM | POA: Insufficient documentation

## 2018-04-15 DIAGNOSIS — R1013 Epigastric pain: Secondary | ICD-10-CM | POA: Diagnosis not present

## 2018-04-15 DIAGNOSIS — R1011 Right upper quadrant pain: Secondary | ICD-10-CM | POA: Insufficient documentation

## 2018-04-15 DIAGNOSIS — Z79899 Other long term (current) drug therapy: Secondary | ICD-10-CM | POA: Insufficient documentation

## 2018-04-15 LAB — COMPREHENSIVE METABOLIC PANEL
ALK PHOS: 58 U/L (ref 38–126)
ALT: 9 U/L (ref 0–44)
AST: 13 U/L — AB (ref 15–41)
Albumin: 3.5 g/dL (ref 3.5–5.0)
Anion gap: 8 (ref 5–15)
BILIRUBIN TOTAL: 0.6 mg/dL (ref 0.3–1.2)
BUN: 8 mg/dL (ref 6–20)
CO2: 24 mmol/L (ref 22–32)
CREATININE: 0.61 mg/dL (ref 0.44–1.00)
Calcium: 8.5 mg/dL — ABNORMAL LOW (ref 8.9–10.3)
Chloride: 108 mmol/L (ref 98–111)
GFR calc Af Amer: >60 mL/min (ref >60)
GFR calc non Af Amer: >60 mL/min (ref >60)
Glucose, Bld: 76 mg/dL (ref 70–99)
Potassium: 3.8 mmol/L (ref 3.5–5.1)
Sodium: 140 mmol/L (ref 135–145)
TOTAL PROTEIN: 6.9 g/dL (ref 6.5–8.1)

## 2018-04-15 LAB — CBC
HCT: 39.5 % (ref 36.0–46.0)
Hemoglobin: 12.3 g/dL (ref 12.0–15.0)
MCH: 27.3 pg (ref 26.0–34.0)
MCHC: 31.1 g/dL (ref 30.0–36.0)
MCV: 87.8 fL (ref 78.0–100.0)
PLATELETS: 161 10*3/uL (ref 150–400)
RBC: 4.5 MIL/uL (ref 3.87–5.11)
RDW: 14.3 % (ref 11.5–15.5)
WBC: 3.2 10*3/uL — AB (ref 4.0–10.5)

## 2018-04-15 LAB — URINALYSIS, ROUTINE W REFLEX MICROSCOPIC
BILIRUBIN URINE: NEGATIVE
Bacteria, UA: NONE SEEN
Glucose, UA: NEGATIVE mg/dL
Ketones, ur: NEGATIVE mg/dL
LEUKOCYTES UA: NEGATIVE
NITRITE: NEGATIVE
PH: 6 (ref 5.0–8.0)
Protein, ur: NEGATIVE mg/dL
SPECIFIC GRAVITY, URINE: 1.024 (ref 1.005–1.030)

## 2018-04-15 LAB — LIPASE, BLOOD: Lipase: 30 U/L (ref 11–51)

## 2018-04-15 LAB — I-STAT BETA HCG BLOOD, ED (MC, WL, AP ONLY): I-stat hCG, quantitative: <5 m[IU]/mL (ref <5)

## 2018-04-15 MED ORDER — SUCRALFATE 1 G PO TABS
1.0000 g | ORAL_TABLET | Freq: Once | ORAL | Status: AC
  Administered 2018-04-15: 1 g via ORAL
  Filled 2018-04-15: qty 1

## 2018-04-15 MED ORDER — RANITIDINE HCL 150 MG PO TABS: 150.0000 mg | ORAL_TABLET | Freq: Two times a day (BID) | ORAL | 0 refills | Status: DC

## 2018-04-15 MED ORDER — ONDANSETRON 4 MG PO TBDP: 4.0000 mg | ORAL_TABLET | Freq: Once | ORAL | Status: DC | PRN

## 2018-04-15 MED ORDER — GI COCKTAIL ~~LOC~~
30.0000 mL | Freq: Once | ORAL | Status: AC
  Administered 2018-04-15: 30 mL via ORAL
  Filled 2018-04-15: qty 30

## 2018-04-15 MED ORDER — RANITIDINE HCL 150 MG/10ML PO SYRP
150.0000 mg | ORAL_SOLUTION | Freq: Once | ORAL | Status: AC
  Administered 2018-04-15: 150 mg via ORAL
  Filled 2018-04-15: qty 10

## 2018-04-15 NOTE — ED Triage Notes (Signed)
Pt reports she has been having intermittent abdominal pain radiating to her back. Pt also reports nausea. She reports she recently started a new birth control.

## 2018-04-15 NOTE — ED Notes (Signed)
Pt going to ultrasound at this time

## 2018-04-15 NOTE — ED Provider Notes (Signed)
[facility] EMERGENCY DEPARTMENT Provider Note   CSN: [id number] Arrival date & time: 04/15/18  4098     History   Chief Complaint Chief Complaint  Patient presents with  . Abdominal Pain    HPI Kaitlyn Espinoza is a 27 y.o. female with history of PCOS, asthma presents for evaluation of acute onset, intermittent epigastric and right upper quadrant abdominal pains for 1 month.  She states that she will experience his pain on average twice weekly for several hours.  She notices it typically occurs the day after eating fried foods or fatty foods.  She is also just started taking a new birth control pill for one month which she thinks may be contributing to her pain.  Pain is sharp and stabbing, radiates to the back.  No aggravating or alleviating factors noted.  She notes nausea when the pain is at its worst.  She denies vomiting, diarrhea, constipation, fevers, chills, melena, hematochezia, or urinary symptoms.  She denies vaginal itching, bleeding, or discharge out of the ordinary.  She has tried Tylenol and Tums without relief of her symptoms.  She states "my sister-in-law just had her gallbladder removed and told me that my symptoms are almost identical to what she was experiencing before she had her gallbladder removed ".  She states that she had an episode of the epigastric abdominal pain this morning while she was at work and it got to the point that she could no longer work and had to come to the emergency department.  She states that the pain has significantly improved.  The history is provided by the patient.    Past Medical History:  Diagnosis Date  . Asthma   . Ovarian cyst     Patient Active Problem List   Diagnosis Date Noted  . Encounter for initial prescription of contraceptive pills 03/11/2018  . Lower abdominal pain 03/11/2018  . Dysmenorrhea 10/11/2015  . Infertility, female 08/16/2015  . PCOS (polycystic ovarian syndrome) 07/19/2015  . Menorrhagia  04/08/2015  . Exercise-induced asthma 04/01/2012    History reviewed. No pertinent surgical history.   OB History   None      Home Medications    Prior to Admission medications   Medication Sig Start Date End Date Taking? Authorizing Provider  acetaminophen (TYLENOL) 500 MG tablet Take 1,000 mg by mouth every 6 (six) hours as needed for moderate pain.   Yes [provider]  albuterol (PROVENTIL HFA) 108 (90 Base) MCG/ACT inhaler Inhale 2 puffs into the lungs every 6 (six) hours as needed for wheezing. 03/11/18 03/11/19 Yes Sw[place], Betty G, MD  fexofenadine (ALLEGRA) 180 MG tablet Take 180 mg by mouth daily.   Yes [provider]  norethindrone-ethinyl estradiol (LOESTRIN FE 1/20) 1-20 MG-MCG tablet Take 1 tablet by mouth daily. 03/11/18  Yes [place], Betty G, MD  ranitidine (ZANTAC) 150 MG tablet Take 1 tablet (150 mg total) by mouth 2 (two) times daily. 04/15/18   Jeanie Sewer, PA-C    Family History Family History  Problem Relation Age of Onset  . Alcohol abuse Father   . Hyperlipidemia Father   . Diabetes Paternal Grandmother     Social History Social History   Tobacco Use  . Smoking status: Never Smoker  . Smokeless tobacco: Never Used  Substance Use Topics  . Alcohol use: No  . Drug use: No     Allergies   Patient has no known allergies.   Review of Systems Review of Systems  Constitutional: Negative for chills and fever.  Respiratory: Negative for shortness of breath.   Cardiovascular: Negative for chest pain.  Gastrointestinal: Positive for abdominal pain and nausea. Negative for constipation, diarrhea and vomiting.  Genitourinary: Negative for dysuria, hematuria, vaginal bleeding, vaginal discharge and vaginal pain.  All other systems reviewed and are negative.    Physical Exam Updated Vital Signs BP 115/66   Pulse 77   Temp 98.7 F (37.1 C) (Oral)   Resp 16   LMP 04/15/2018 (Within Days)   SpO2 100%   Physical Exam    Constitutional: She appears well-developed and well-nourished. No distress.  HENT:  Head: Normocephalic and atraumatic.  Eyes: Conjunctivae are normal. Right eye exhibits no discharge. Left eye exhibits no discharge.  Neck: No JVD present. No tracheal deviation present.  Cardiovascular: Normal rate, regular rhythm and normal heart sounds.  Pulmonary/Chest: Effort normal and breath sounds normal.  Abdominal: Soft. Bowel sounds are normal. She exhibits no distension. There is tenderness in the right upper quadrant and epigastric area. There is no rigidity, no rebound, no guarding, no CVA tenderness, no tenderness at McBurney's point and negative Murphy's sign.  Musculoskeletal: She exhibits no edema.  No midline spine TTP, no paraspinal muscle tenderness, no deformity, crepitus, or step-off noted   Neurological: She is alert.  Skin: Skin is warm and dry. No erythema.  Psychiatric: She has a normal mood and affect. Her behavior is normal.  Nursing note and vitals reviewed.    ED Treatments / Results  Labs (all labs ordered are listed, but only abnormal results are displayed) Labs Reviewed  COMPREHENSIVE METABOLIC PANEL - Abnormal; Notable for the following components:      Result Value   Calcium 8.5 (*)    AST 13 (*)    All other components within normal limits  CBC - Abnormal; Notable for the following components:   WBC 3.2 (*)    All other components within normal limits  URINALYSIS, ROUTINE W REFLEX MICROSCOPIC - Abnormal; Notable for the following components:   Hgb urine dipstick MODERATE (*)    All other components within normal limits  LIPASE, BLOOD  I-STAT BETA HCG BLOOD, ED ([facility], [facility], [facility] ONLY)    EKG None  Radiology Koreas Abdomen Limited Ruq  Result Date: 04/15/2018 CLINICAL DATA:  Postprandial right upper quadrant abdominal pain and nausea. EXAM: ULTRASOUND ABDOMEN LIMITED RIGHT UPPER QUADRANT COMPARISON:  None. FINDINGS: Gallbladder: No gallstones or wall thickening  visualized. No sonographic Murphy sign noted by sonographer. Common bile duct: Diameter: 3 mm. Liver: No focal lesion identified. Within normal limits in parenchymal echogenicity. Portal vein is patent on color Doppler imaging with normal direction of blood flow towards the liver. IMPRESSION: Normal right upper quadrant abdominal ultrasound. Electronically Signed   By: Irish LackGlenn  Yamagata M.D.   On: 04/15/2018 16:38    Procedures Procedures (including critical care time)  Medications Ordered in ED Medications  ondansetron (ZOFRAN-ODT) disintegrating tablet 4 mg (has no administration in time range)  gi cocktail (Maalox,Lidocaine,Donnatal) (30 mLs Oral Given 04/15/18 1400)  sucralfate (CARAFATE) tablet 1 g (1 g Oral Given 04/15/18 1401)  ranitidine (ZANTAC) 150 MG/10ML syrup 150 mg (150 mg Oral Given 04/15/18 1401)     Initial Impression / Assessment and Plan / ED Course  I have reviewed the triage vital signs and the nursing notes.  Pertinent labs & imaging results that were available during my care of the patient were reviewed by me and considered in my medical decision making (see  chart for details).     Patient with intermittent right upper quadrant and epigastric abdominal pain radiating to the back for 1 month.  She is afebrile, vital signs are stable.  She is nontoxic in appearance.  No peritoneal signs on examination of the abdomen.  No lower abdominal tenderness to suggest PID, ovarian torsion, TOA, or ectopic pregnancy.  Lab work reviewed by me shows no leukocytosis, no anemia.  LFTs are not elevated.  Creatinine and lipase are within normal limits.  UA is not concerning for UTI or nephrolithiasis.  No complaint of chest pain and I doubt cardiopulmonary abnormality as the cause of her symptoms.  Right upper quadrant ultrasound shows no evidence of cholelithiasis, choledocholithiasis, cholecystitis, or other acute gallbladder or hepatic abnormality.  No evidence of pancreatitis.  I doubt  obstruction, perforation, appendicitis, colitis, or other acute surgical abdominal pathology.  Patient is tolerating p.o. food and fluids and repeat abdominal examinations remain benign.  Symptoms improved with Zofran, GI cocktail, Carafate, and Zantac.  Stable for discharge home with Zantac.  She will follow-up with her PCP for reevaluation of her symptoms.  Suspect GERD.  Discussed diet and left eye modifications.  Discussed strict ED return precautions. Pt verbalized understanding of and agreement with plan and is safe for discharge home at this time.   Final Clinical Impressions(s) / ED Diagnoses   Final diagnoses:  Postprandial RUQ pain  Epigastric pain    ED Discharge Orders        Ordered    ranitidine (ZANTAC) 150 MG tablet  2 times daily     04/15/18 1704       Jeanie Sewer, PA-C 04/15/18 1707    Mesner, Barbara Cower, MD 04/15/18 2103

## 2018-04-15 NOTE — Discharge Instructions (Signed)
1. Medications:Take [phone] mg of Tylenol every 6 hours as needed for pain. Do not exceed 4000 mg of Tylenol daily.  Take Zantac twice daily for symptoms. 2. Treatment: rest, drink plenty of fluids, advance diet slowly.  Eat a diet of bland foods that will not upset your stomach.  Avoid fried foods, fatty foods, acidic foods such as tomato sauce or orange juice, alcohol.  Elevate the head of the bed. 3. Follow Up: Please followup with your primary doctor in 3 days for discussion of your diagnoses and further evaluation after today's visit; if you do not have a primary care doctor use the resource guide provided to find one; Please return to the ER for persistent vomiting, high fevers, or worsening symptoms

## 2018-06-17 ENCOUNTER — Telehealth: Payer: Self-pay

## 2018-06-17 NOTE — Telephone Encounter (Signed)
Message sent to Dr. Jordan for review. Please advise 

## 2018-06-17 NOTE — Telephone Encounter (Signed)
Copied from CRM [id number]. Topic: Quick Communication - See Telephone Encounter >> Jun 17, 2018 10:28 AM Herby AbrahamJohnson, Shiquita C wrote: CRM for notification. See Telephone encounter for: 06/17/18.  Pt says that she was seen and prescribed birth control pills, pt says that she would like to start back taking the depo shot instead and would like assistance with scheduling. Could pt have nurse visit?, or should pt be seen by provider again?   Please advise and assist.    CB: [phone]

## 2018-06-18 ENCOUNTER — Other Ambulatory Visit: Payer: Self-pay | Admitting: *Deleted

## 2018-06-18 MED ORDER — MEDROXYPROGESTERONE ACETATE 150 MG/ML IM SUSP: 150.0000 mg | INTRAMUSCULAR | 3 refills | Status: DC

## 2018-06-18 NOTE — Telephone Encounter (Signed)
Patient informed that it is okay to start depo-provera injections every 3 months. Patient verbalized understanding. Patient scheduled for injection on 06/20/18.

## 2018-06-18 NOTE — Telephone Encounter (Signed)
It is okay to start Depo-Provera injections 150 mg q 3 months.  Thanks, BJ

## 2018-06-20 ENCOUNTER — Ambulatory Visit (INDEPENDENT_AMBULATORY_CARE_PROVIDER_SITE_OTHER): Payer: 59 | Admitting: *Deleted

## 2018-06-20 DIAGNOSIS — Z3042 Encounter for surveillance of injectable contraceptive: Secondary | ICD-10-CM | POA: Diagnosis not present

## 2018-06-20 LAB — POCT URINE PREGNANCY: PREG TEST UR: NEGATIVE

## 2018-06-20 MED ORDER — MEDROXYPROGESTERONE ACETATE 150 MG/ML IM SUSP
150.0000 mg | Freq: Once | INTRAMUSCULAR | Status: AC
Start: 2018-06-20 — End: 2018-06-20
  Administered 2018-06-20: 150 mg via INTRAMUSCULAR

## 2018-06-20 NOTE — Progress Notes (Signed)
Patient her to restart Depo-Provera. Stopped OCP about 2 months ago and has not been using any birth control since. Urine pregnancy test negative today.  Per orders of Dr. Swazil[place]Jordan, injection of Depo Provera 150 mg given by Starla Linkarolyn J Kateryn Marasigan. Patient tolerated injection well.

## 2018-09-29 ENCOUNTER — Ambulatory Visit: Payer: Self-pay | Admitting: *Deleted

## 2018-09-29 NOTE — Telephone Encounter (Signed)
Pt is having itchy and irritation and burning when she urinated to irritate the spot so the area burn not when urine is coming out and would like something called into her pharmacy she has tried cream OTC for a yeast infection  [facility], [place] - [address] [phone])  Patient is requesting oral yeast treatment- she used the over the counter - but she has a hard time remembering to use it every night- and then she doesn't finish- she is requesting the pill so she can get rid of the yeast and be done with her symptoms.  Reason for Disposition . [1] Symptoms of a yeast infection (i.e., itchy, white discharge, not bad smelling) AND    [2] not improved > 3 days following CARE ADVICE  Answer Assessment - Initial Assessment Questions 1. SYMPTOM: "What's the main symptom you're concerned about?" (e.g., pain, itching, dryness)     Itching and skin irritation- it burns if urine hits it- chaffing in area 2. LOCATION: "Where is the  itching located?" (e.g., inside/outside, left/right)     outer vulva 3. ONSET: "When did the  itching  start?"     2 weeks- patient did try the over the counter- she is having trouble being regular with it 4. PAIN: "Is there any pain?" If so, ask: "How bad is it?" (Scale: 1-10; mild, moderate, severe)     no 5. ITCHING: "Is there any itching?" If so, ask: "How bad is it?" (Scale: 1-10; mild, moderate, severe)     Yes- mild 6. CAUSE: "What do you think is causing the discharge?" "Have you had the same problem before? What happened then?"     Yeast infection- yes- patient is requesting oral treatment 7. OTHER SYMPTOMS: "Do you have any other symptoms?" (e.g., fever, itching, vaginal bleeding, pain with urination, injury to genital area, vaginal foreign body)     That's all- irritating to patient 8. PREGNANCY: "Is there any chance you are pregnant?" "When was your last menstrual period?"     No- LMP- using Depo  Provera  Protocols used: [facility]

## 2018-09-29 NOTE — Telephone Encounter (Signed)
Patient informed that she needed an appointment, scheduled for 10/08/18.

## 2018-10-08 ENCOUNTER — Ambulatory Visit: Payer: 59 | Admitting: Family Medicine

## 2018-11-03 DIAGNOSIS — N9089 Other specified noninflammatory disorders of vulva and perineum: Secondary | ICD-10-CM | POA: Diagnosis not present

## 2018-11-03 DIAGNOSIS — B373 Candidiasis of vulva and vagina: Secondary | ICD-10-CM | POA: Diagnosis not present

## 2018-11-03 DIAGNOSIS — R234 Changes in skin texture: Secondary | ICD-10-CM | POA: Diagnosis not present

## 2019-02-11 DIAGNOSIS — B373 Candidiasis of vulva and vagina: Secondary | ICD-10-CM | POA: Diagnosis not present

## 2019-02-11 DIAGNOSIS — N898 Other specified noninflammatory disorders of vagina: Secondary | ICD-10-CM | POA: Diagnosis not present

## 2019-08-19 ENCOUNTER — Other Ambulatory Visit: Payer: Self-pay

## 2019-08-19 ENCOUNTER — Encounter (HOSPITAL_COMMUNITY): Payer: Self-pay | Admitting: *Deleted

## 2019-08-19 ENCOUNTER — Inpatient Hospital Stay (HOSPITAL_COMMUNITY)
Admission: EM | Admit: 2019-08-19 | Discharge: 2019-08-19 | Disposition: A | Discharge status: Home / Self Care | Payer: Medicaid Other | Attending: Family Medicine | Admitting: Family Medicine

## 2019-08-19 ENCOUNTER — Inpatient Hospital Stay (HOSPITAL_COMMUNITY): Payer: Medicaid Other

## 2019-08-19 DIAGNOSIS — O23591 Infection of other part of genital tract in pregnancy, first trimester: Secondary | ICD-10-CM | POA: Diagnosis not present

## 2019-08-19 DIAGNOSIS — B9689 Other specified bacterial agents as the cause of diseases classified elsewhere: Secondary | ICD-10-CM | POA: Diagnosis not present

## 2019-08-19 DIAGNOSIS — Z3A00 Weeks of gestation of pregnancy not specified: Secondary | ICD-10-CM | POA: Diagnosis not present

## 2019-08-19 DIAGNOSIS — R109 Unspecified abdominal pain: Secondary | ICD-10-CM | POA: Diagnosis not present

## 2019-08-19 DIAGNOSIS — O3680X0 Pregnancy with inconclusive fetal viability, not applicable or unspecified: Secondary | ICD-10-CM | POA: Diagnosis not present

## 2019-08-19 DIAGNOSIS — Z3A01 Less than 8 weeks gestation of pregnancy: Secondary | ICD-10-CM | POA: Diagnosis not present

## 2019-08-19 DIAGNOSIS — N760 Acute vaginitis: Secondary | ICD-10-CM

## 2019-08-19 DIAGNOSIS — O26891 Other specified pregnancy related conditions, first trimester: Secondary | ICD-10-CM | POA: Diagnosis not present

## 2019-08-19 DIAGNOSIS — O26899 Other specified pregnancy related conditions, unspecified trimester: Secondary | ICD-10-CM | POA: Diagnosis not present

## 2019-08-19 LAB — CBC
HCT: 38.6 % (ref 36.0–46.0)
Hemoglobin: 12.5 g/dL (ref 12.0–15.0)
MCH: 27.5 pg (ref 26.0–34.0)
MCHC: 32.4 g/dL (ref 30.0–36.0)
MCV: 85 fL (ref 80.0–100.0)
Platelets: 153 10*3/uL (ref 150–400)
RBC: 4.54 MIL/uL (ref 3.87–5.11)
RDW: 13.8 % (ref 11.5–15.5)
WBC: 6.9 10*3/uL (ref 4.0–10.5)
nRBC: 0 % (ref 0.0–0.2)

## 2019-08-19 LAB — GC/CHLAMYDIA PROBE AMP (~~LOC~~) NOT AT ARMC
Chlamydia: NEGATIVE
Comment: NEGATIVE
Comment: NORMAL
Neisseria Gonorrhea: NEGATIVE

## 2019-08-19 LAB — HCG, QUANTITATIVE, PREGNANCY: hCG, Beta Chain, Quant, S: 1301 m[IU]/mL — ABNORMAL HIGH (ref <5)

## 2019-08-19 LAB — WET PREP, GENITAL
Sperm: NONE SEEN
Trich, Wet Prep: NONE SEEN
Yeast Wet Prep HPF POC: NONE SEEN

## 2019-08-19 LAB — I-STAT BETA HCG BLOOD, ED (MC, WL, AP ONLY): I-stat hCG, quantitative: 999.3 m[IU]/mL — ABNORMAL HIGH (ref <5)

## 2019-08-19 LAB — ABO/RH: ABO/RH(D): O POS

## 2019-08-19 MED ORDER — METRONIDAZOLE 500 MG PO TABS: 500.0000 mg | ORAL_TABLET | Freq: Two times a day (BID) | ORAL | 0 refills | Status: DC

## 2019-08-19 NOTE — MAU Note (Signed)
Pt reports to MAU from [facility] stating she is having some lower abdominal cramping that is a 6/10 on the pain scale. Pt states this has been ongoing x1week but this week it has become a more constant pain. Pt states it is a dull pain. No bleeding. Pt reports a watery discharge that started 1 week ago as well.

## 2019-08-19 NOTE — MAU Provider Note (Signed)
History     CSN: [id number]  Arrival date and time: 08/19/19 [id number]   First Provider Initiated Contact with Patient 08/19/19 0447      Chief Complaint  Patient presents with  . Abdominal Pain  . Back Pain   Kaitlyn Espinoza is a 28 y.o. G2P1 at 2359w4d by LMP who presents to MAU with complaints of abdominal pain. She reports abdominal pain has been occurring for the past week. Describes abdominal pain as a constant dull pain that she rates 5-6/10, occasionally the pain increases and radiates around to her back, when that occurs she rates pain 10/10. Has not taken any medication for pain as she does not like pain medication. She reports vaginal discharge- describes as white thin discharge without odor or itching. Denies vaginal bleeding.    OB History    Gravida  2   Para  1   Term  1   Preterm      AB      Living  1     SAB      TAB      Ectopic      Multiple      Live Births  1           Past Medical History:  Diagnosis Date  . Asthma   . Ovarian cyst     Past Surgical History:  Procedure Laterality Date  . NO PAST SURGERIES      Family History  Problem Relation Age of Onset  . Alcohol abuse Father   . Hyperlipidemia Father   . Diabetes Paternal Grandmother     Social History   Tobacco Use  . Smoking status: Never Smoker  . Smokeless tobacco: Never Used  Substance Use Topics  . Alcohol use: No  . Drug use: No    Allergies: No Known Allergies  Facility-Administered Medications Prior to Admission  Medication Dose Route Frequency Provider Last Rate Last Dose  . tuberculin injection 5 Units  5 Units Intradermal Once Macy MisBriscoe, Kim K, MD       Medications Prior to Admission  Medication Sig Dispense Refill Last Dose  . acetaminophen (TYLENOL) 500 MG tablet Take 1,000 mg by mouth every 6 (six) hours as needed for moderate pain.     Marland Kitchen. albuterol (PROVENTIL HFA) 108 (90 Base) MCG/ACT inhaler Inhale 2 puffs into the lungs every 6 (six) hours as needed  for wheezing. 1 Inhaler 2   . fexofenadine (ALLEGRA) 180 MG tablet Take 180 mg by mouth daily.     . medroxyPROGESTERone (DEPO-PROVERA) 150 MG/ML injection Inject 1 mL (150 mg total) into the muscle every 3 (three) months. 1 mL 3   . ranitidine (ZANTAC) 150 MG tablet Take 1 tablet (150 mg total) by mouth 2 (two) times daily. 60 tablet 0     Review of Systems  Constitutional: Negative.   Respiratory: Negative.   Cardiovascular: Negative.   Gastrointestinal: Positive for abdominal pain. Negative for constipation, diarrhea, nausea and vomiting.  Genitourinary: Positive for vaginal discharge. Negative for difficulty urinating, dysuria, frequency, pelvic pain, urgency and vaginal bleeding.  Musculoskeletal: Positive for back pain.  Neurological: Negative.    Physical Exam   Blood pressure 115/63, pulse 82, temperature 98.3 F (36.8 C), temperature source Oral, resp. rate 17, last menstrual period 06/27/2019, SpO2 100 %.  Physical Exam  Nursing note and vitals reviewed. Constitutional: She is oriented to person, place, and time. She appears well-developed and well-nourished. No distress.  Cardiovascular: Normal rate and regular  rhythm.  Respiratory: Breath sounds normal. No respiratory distress. She has no wheezes.  GI: Soft. She exhibits no distension. There is abdominal tenderness. There is no rebound and no guarding.  Tenderness with palpation in suprapubic region  Genitourinary:    Genitourinary Comments: Blind swabs collected   Neurological: She is alert and oriented to person, place, and time.  Psychiatric: She has a normal mood and affect. Her behavior is normal. Thought content normal.   MAU Course  Procedures  MDM Orders Placed This Encounter  Procedures  . Wet prep, genital  . US OB LESS THAN 14 WEEKS WITH OB TRANSVAGINAL  . F/u ultrasound for viability  . CBC  . hCG, quantitative, pregnancy  . I-Stat beta hCG blood, ED  . ABO/Rh   Labs and [place] report reviewed:   Results for orders placed or performed during the hospital encounter of 08/19/19 (from the past 24 hour(s))  I-Stat beta hCG blood, ED     Status: Abnormal   Collection Time: 08/19/19  2:42 AM  Result Value Ref Range   I-stat hCG, quantitative 999.3 (H) <5 mIU/mL   Comment 3          Wet prep, genital     Status: Abnormal   Collection Time: 08/19/19  3:30 AM  Result Value Ref Range   Yeast Wet Prep HPF POC NONE SEEN NONE SEEN   Trich, Wet Prep NONE SEEN NONE SEEN   Clue Cells Wet Prep HPF POC PRESENT (A) NONE SEEN   WBC, Wet Prep HPF POC MANY (A) NONE SEEN   Sperm NONE SEEN   CBC     Status: None   Collection Time: 08/19/19  3:34 AM  Result Value Ref Range   WBC 6.9 4.0 - 10.5 K/uL   RBC 4.54 3.87 - 5.11 MIL/uL   Hemoglobin 12.5 12.0 - 15.0 g/dL   HCT 63.8 75.6 - 43.3 %   MCV 85.0 80.0 - 100.0 fL   MCH 27.5 26.0 - 34.0 pg   MCHC 32.4 30.0 - 36.0 g/dL   RDW 29.5 18.8 - 41.6 %   Platelets 153 150 - 400 K/uL   nRBC 0.0 0.0 - 0.2 %  ABO/Rh     Status: None   Collection Time: 08/19/19  3:34 AM  Result Value Ref Range   ABO/RH(D) O POS    No rh immune globuloin      NOT A RH IMMUNE GLOBULIN CANDIDATE, PT RH POSITIVE Performed at [facility], [address], [place], [place] [address]   hCG, quantitative, pregnancy     Status: Abnormal   Collection Time: 08/19/19  3:34 AM  Result Value Ref Range   hCG, Beta Chain, Quant, S 1,301 (H) <5 mIU/mL   US Ob Less Than 14 Weeks With Ob Transvaginal  Result Date: 08/19/2019 CLINICAL DATA:  Abdominal pain and first-trimester pregnancy EXAM: OBSTETRIC <14 WK [place] AND TRANSVAGINAL OB US TECHNIQUE: Both transabdominal and transvaginal ultrasound examinations were performed for complete evaluation of the gestation as well as the maternal uterus, adnexal regions, and pelvic cul-de-sac. Transvaginal technique was performed to assess early pregnancy. COMPARISON:  Pelvic ultrasound 12/23/2015 FINDINGS: There is an intra decidual cystic  structure with a mean sac diameter of 2.8 mm. No internal fetal pole or yolk sac is visible. Peripherally hypervascular cystic structure with thick wall at the right adnexa, intra-ovarian based on claw sign. This is most consistent with corpus luteum. No left adnexal mass. Pelvic fluid which is simple  in nature and overall small volume although seen bilateral and nearly moderate. No adnexal mass. IMPRESSION: 1. Pregnancy of unknown location. There is a small intra decidual cystic structure measuring 2.8 mm mean sac diameter, but no identifiable yolk sac or fetal pole. Consider follow-up ultrasound in 10 days and serial quantitative beta HCG follow-up. 2. Right adnexal cystic structure is intra-ovarian and consistent with corpus luteum. No extra ovarian adnexal mass. 3. Pelvic fluid that is simple. Electronically Signed   By: Monte Fantasia M.D.   On: 08/19/2019 04:43   Discussed results of [place] and lab work with patient. Educated on the importance of close follow up HCG and repeat US in 10-14 days. Follow up HCG scheduled for 11/20 at [facility]. Ectopic precautions discussed, warning signs and reasons to return to MAU discussed- patient verbalizes understanding.   Discussed possibility of MTX if ectopic pregnancy confirmed or HCG level stay around the same and does not double or decrease significantly- patient verbalizes understanding.  Return to MAU as needed. Pt stable at time of discharge. Rx for BV sent to pharmacy of choice d/t clinical symptoms.    Assessment and Plan   1. Pregnancy of unknown anatomic location   2. Abdominal pain during pregnancy in first trimester   3. Bacterial vaginosis    Discharge home Follow up as scheduled in the office on Friday for stat HCG  Return to MAU as needed for reasons discussed and/or emergencies  Rx for Flagyl sent to pharmacy  Ectopic precautions and warning signs   [place] [facility]. Go on 08/21/2019.    Specialty: Obstetrics and Gynecology Contact information: [address], [place] [phone] [place] [phone]         Allergies as of 08/19/2019   No Known Allergies     Medication List    STOP taking these medications   medroxyPROGESTERone 150 MG/ML injection Commonly known as: Depo-Provera     TAKE these medications   acetaminophen 500 MG tablet Commonly known as: TYLENOL Take 1,000 mg by mouth every 6 (six) hours as needed for moderate pain.   albuterol 108 (90 Base) MCG/ACT inhaler Commonly known as: Proventil HFA Inhale 2 puffs into the lungs every 6 (six) hours as needed for wheezing.   fexofenadine 180 MG tablet Commonly known as: ALLEGRA Take 180 mg by mouth daily.   metroNIDAZOLE 500 MG tablet Commonly known as: FLAGYL Take 1 tablet (500 mg total) by mouth 2 (two) times daily.   ranitidine 150 MG tablet Commonly known as: ZANTAC Take 1 tablet (150 mg total) by mouth 2 (two) times daily.       Lajean Manes CNM 08/19/2019, 5:09 AM

## 2019-08-20 ENCOUNTER — Telehealth: Payer: Self-pay | Admitting: Family Medicine

## 2019-08-20 NOTE — Telephone Encounter (Signed)
Spoke to patient about her appointment on 11/20 @ 9:00. Patient instructed to wear a face mask for the entire appointment and no visitors are allowed with her during the visit. Patient screened for covid symptoms and denied having any °

## 2019-08-21 ENCOUNTER — Ambulatory Visit (INDEPENDENT_AMBULATORY_CARE_PROVIDER_SITE_OTHER): Payer: Medicaid Other

## 2019-08-21 ENCOUNTER — Other Ambulatory Visit: Payer: Self-pay

## 2019-08-21 DIAGNOSIS — O3680X0 Pregnancy with inconclusive fetal viability, not applicable or unspecified: Secondary | ICD-10-CM | POA: Diagnosis not present

## 2019-08-21 LAB — BETA HCG QUANT (REF LAB): hCG Quant: 3046 m[IU]/mL

## 2019-08-21 NOTE — Progress Notes (Signed)
Pt here today for STAT Beta Lab.  Pt reports that she is here today because she is getting hormone level checked to see if they rise like they should because her [place] did not show anything.  I agreed with the pt and informed her that it will take approximately two hours to get lab results back in which I will call her with f/u.  Pt denies any pain or bleeding.  Pt verbalized understanding.    Received notification from [place] that stat beta results are 3,056.  Notified Dr. Roselie Awkward who recommends that pt have an [place] scheduled in 10-14 days.  OB US scheduled for 09/02/19 @ 1300.  Notified pt of results and verified [place] appt.  I also advised that if she starts to have vaginal bleeding or pain to please go to MAU.  Pt verbalized understanding.    Mel Almond, RN 08/21/19

## 2019-08-31 ENCOUNTER — Telehealth: Payer: Self-pay | Admitting: Advanced Practice Midwife

## 2019-08-31 ENCOUNTER — Telehealth: Payer: Self-pay

## 2019-08-31 DIAGNOSIS — O219 Vomiting of pregnancy, unspecified: Secondary | ICD-10-CM

## 2019-08-31 MED ORDER — PROMETHAZINE HCL 25 MG PO TABS: 25.0000 mg | ORAL_TABLET | Freq: Four times a day (QID) | ORAL | 0 refills | Status: DC | PRN

## 2019-08-31 NOTE — Telephone Encounter (Signed)
The patient called in stating she has a really bad nausea. She would like a prescription sent in. She stated she visited the [place] on [address]. She is currently 9 weeks and is not a patient at our office outside of u*s results. She stated she has an ob however they are so backed up they haven't seen her yet.   Consulted with nurse Vaughan Basta, informed the patient to expect a call by the close of business today.

## 2019-08-31 NOTE — Addendum Note (Signed)
Addended by: Samuel Germany on: 08/31/2019 03:30 PM   Modules accepted: Orders

## 2019-08-31 NOTE — Telephone Encounter (Signed)
Opened in Error.  Phenergan e-prescribed per protocol.

## 2019-08-31 NOTE — Telephone Encounter (Signed)
I called Curry and she request nausea medication . She denies any allergies. Phenergan sent in per protocol. Advised to try 1/2 tablet first dose because may cause sleepiness and avoid driving or work when taking first dose. She voices understanding. Linda,RN

## 2019-09-02 ENCOUNTER — Encounter: Payer: Self-pay | Admitting: Family Medicine

## 2019-09-02 ENCOUNTER — Ambulatory Visit (HOSPITAL_COMMUNITY)
Admission: RE | Admit: 2019-09-02 | Discharge: 2019-09-02 | Disposition: A | Discharge status: Home / Self Care | Payer: Medicaid Other | Source: Ambulatory Visit | Attending: Certified Nurse Midwife | Admitting: Certified Nurse Midwife

## 2019-09-02 ENCOUNTER — Ambulatory Visit (INDEPENDENT_AMBULATORY_CARE_PROVIDER_SITE_OTHER): Payer: Medicaid Other

## 2019-09-02 ENCOUNTER — Other Ambulatory Visit: Payer: Self-pay

## 2019-09-02 DIAGNOSIS — Z3A01 Less than 8 weeks gestation of pregnancy: Secondary | ICD-10-CM

## 2019-09-02 DIAGNOSIS — O3680X0 Pregnancy with inconclusive fetal viability, not applicable or unspecified: Secondary | ICD-10-CM

## 2019-09-02 DIAGNOSIS — Z3491 Encounter for supervision of normal pregnancy, unspecified, first trimester: Secondary | ICD-10-CM | POA: Diagnosis not present

## 2019-09-02 NOTE — Progress Notes (Signed)
Pt here today for OB US results.  Notified Marcille Buffy, CNM [place] results no new orders.  Pt informed that she has a viable pregnancy with EDD 04/25/20, 6w 3d today, and FHR 114.  Pt denies any pain or bleeding.  Medications and allergies reviewed.  [place] pics given.  Proof of pregnancy letter provided by the front office.    Mel Almond, RN 09/02/19

## 2019-09-02 NOTE — Progress Notes (Signed)
Patient seen and assessed by nursing staff during this encounter. I have reviewed the chart and agree with the documentation and plan.  Marcille Buffy DNP, CNM  09/02/19  4:51 PM

## 2019-09-08 DIAGNOSIS — O3680X0 Pregnancy with inconclusive fetal viability, not applicable or unspecified: Secondary | ICD-10-CM | POA: Diagnosis not present

## 2019-09-08 DIAGNOSIS — Z3A01 Less than 8 weeks gestation of pregnancy: Secondary | ICD-10-CM | POA: Diagnosis not present

## 2019-09-08 DIAGNOSIS — Z363 Encounter for antenatal screening for malformations: Secondary | ICD-10-CM | POA: Diagnosis not present

## 2019-09-08 DIAGNOSIS — Z3689 Encounter for other specified antenatal screening: Secondary | ICD-10-CM | POA: Diagnosis not present

## 2019-09-08 DIAGNOSIS — Z124 Encounter for screening for malignant neoplasm of cervix: Secondary | ICD-10-CM | POA: Diagnosis not present

## 2019-09-08 DIAGNOSIS — Z113 Encounter for screening for infections with a predominantly sexual mode of transmission: Secondary | ICD-10-CM | POA: Diagnosis not present

## 2019-09-08 DIAGNOSIS — Z01419 Encounter for gynecological examination (general) (routine) without abnormal findings: Secondary | ICD-10-CM | POA: Diagnosis not present

## 2019-09-08 DIAGNOSIS — Z3682 Encounter for antenatal screening for nuchal translucency: Secondary | ICD-10-CM | POA: Diagnosis not present

## 2019-09-08 DIAGNOSIS — Z0000 Encounter for general adult medical examination without abnormal findings: Secondary | ICD-10-CM | POA: Diagnosis not present

## 2019-10-13 DIAGNOSIS — Z3689 Encounter for other specified antenatal screening: Secondary | ICD-10-CM | POA: Diagnosis not present

## 2019-10-13 DIAGNOSIS — Z3A12 12 weeks gestation of pregnancy: Secondary | ICD-10-CM | POA: Diagnosis not present

## 2019-10-13 DIAGNOSIS — Z3682 Encounter for antenatal screening for nuchal translucency: Secondary | ICD-10-CM | POA: Diagnosis not present

## 2019-11-01 DIAGNOSIS — R102 Pelvic and perineal pain: Secondary | ICD-10-CM | POA: Diagnosis not present

## 2019-11-01 DIAGNOSIS — Z3A16 16 weeks gestation of pregnancy: Secondary | ICD-10-CM | POA: Diagnosis not present

## 2019-11-01 DIAGNOSIS — B9689 Other specified bacterial agents as the cause of diseases classified elsewhere: Secondary | ICD-10-CM | POA: Diagnosis not present

## 2019-11-01 DIAGNOSIS — O26892 Other specified pregnancy related conditions, second trimester: Secondary | ICD-10-CM | POA: Diagnosis not present

## 2019-11-01 DIAGNOSIS — O23592 Infection of other part of genital tract in pregnancy, second trimester: Secondary | ICD-10-CM | POA: Diagnosis not present

## 2019-11-01 DIAGNOSIS — Z3A14 14 weeks gestation of pregnancy: Secondary | ICD-10-CM | POA: Diagnosis not present

## 2019-11-10 DIAGNOSIS — B9689 Other specified bacterial agents as the cause of diseases classified elsewhere: Secondary | ICD-10-CM | POA: Diagnosis not present

## 2019-11-10 DIAGNOSIS — Z3689 Encounter for other specified antenatal screening: Secondary | ICD-10-CM | POA: Diagnosis not present

## 2019-11-10 DIAGNOSIS — N760 Acute vaginitis: Secondary | ICD-10-CM | POA: Diagnosis not present

## 2019-11-10 DIAGNOSIS — Z3A16 16 weeks gestation of pregnancy: Secondary | ICD-10-CM | POA: Diagnosis not present

## 2019-11-27 DIAGNOSIS — R102 Pelvic and perineal pain: Secondary | ICD-10-CM | POA: Diagnosis not present

## 2019-11-27 DIAGNOSIS — O99891 Other specified diseases and conditions complicating pregnancy: Secondary | ICD-10-CM | POA: Diagnosis not present

## 2019-11-27 DIAGNOSIS — Z3A18 18 weeks gestation of pregnancy: Secondary | ICD-10-CM | POA: Diagnosis not present

## 2019-12-04 DIAGNOSIS — Z363 Encounter for antenatal screening for malformations: Secondary | ICD-10-CM | POA: Diagnosis not present

## 2019-12-04 DIAGNOSIS — Z3A19 19 weeks gestation of pregnancy: Secondary | ICD-10-CM | POA: Diagnosis not present

## 2020-01-10 ENCOUNTER — Other Ambulatory Visit: Payer: Self-pay

## 2020-01-10 ENCOUNTER — Emergency Department (HOSPITAL_COMMUNITY)
Admission: EM | Admit: 2020-01-10 | Discharge: 2020-01-10 | Disposition: A | Discharge status: Home / Self Care | Payer: Medicaid Other | Attending: Emergency Medicine | Admitting: Emergency Medicine

## 2020-01-10 ENCOUNTER — Emergency Department (HOSPITAL_COMMUNITY): Payer: Medicaid Other

## 2020-01-10 ENCOUNTER — Encounter (HOSPITAL_COMMUNITY): Payer: Self-pay | Admitting: Emergency Medicine

## 2020-01-10 DIAGNOSIS — W1830XA Fall on same level, unspecified, initial encounter: Secondary | ICD-10-CM | POA: Insufficient documentation

## 2020-01-10 DIAGNOSIS — O2692 Pregnancy related conditions, unspecified, second trimester: Secondary | ICD-10-CM | POA: Diagnosis not present

## 2020-01-10 DIAGNOSIS — O9A212 Injury, poisoning and certain other consequences of external causes complicating pregnancy, second trimester: Secondary | ICD-10-CM | POA: Diagnosis not present

## 2020-01-10 DIAGNOSIS — Y999 Unspecified external cause status: Secondary | ICD-10-CM | POA: Diagnosis not present

## 2020-01-10 DIAGNOSIS — M79671 Pain in right foot: Secondary | ICD-10-CM | POA: Diagnosis not present

## 2020-01-10 DIAGNOSIS — T1490XA Injury, unspecified, initial encounter: Secondary | ICD-10-CM

## 2020-01-10 DIAGNOSIS — Z3A25 25 weeks gestation of pregnancy: Secondary | ICD-10-CM | POA: Insufficient documentation

## 2020-01-10 DIAGNOSIS — Y92019 Unspecified place in single-family (private) house as the place of occurrence of the external cause: Secondary | ICD-10-CM | POA: Diagnosis not present

## 2020-01-10 DIAGNOSIS — Y9301 Activity, walking, marching and hiking: Secondary | ICD-10-CM | POA: Insufficient documentation

## 2020-01-10 DIAGNOSIS — S93601A Unspecified sprain of right foot, initial encounter: Secondary | ICD-10-CM | POA: Diagnosis not present

## 2020-01-10 NOTE — ED Triage Notes (Addendum)
Pt [redacted] weeks pregnant. Pt reports she was walking out of her door when she tripped and stumbled to the ground about one hour ago. Pt reports right foot pain - on the outer side of foot. Pt reports difficulty bearing weight. Ice pack applied in triage.

## 2020-01-10 NOTE — ED Provider Notes (Signed)
[facility] EMERGENCY DEPARTMENT Provider Note   CSN: [id number] Arrival date & time: 01/10/20  1926     History Chief Complaint  Patient presents with  . Fall  . Foot Pain    Kaitlyn Espinoza is a 29 y.o. female history of asthma, [redacted] weeks pregnant.  Patient presents today for right foot pain, she was walking out of her door today down the threshold when her foot slipped out from under her causing her to fall to the ground.  She denies landing on her abdomen, her only complaint is right foot pain.  Tenderness along the lateral aspect of the right foot, pain described as a throbbing sensation constant moderate intensity worsened with ambulation improved with rest, has not attempted any medications prior to arrival, denies any associated swelling or color change.  Denies head injury, loss of consciousness, neck pain, back pain, chest pain, abdominal pain, nausea/vomiting, vaginal bleeding/fluid loss, pain of the other 3 extremities or any additional concerns.  HPI     Past Medical History:  Diagnosis Date  . Asthma   . Ovarian cyst     Patient Active Problem List   Diagnosis Date Noted  . Encounter for initial prescription of contraceptive pills 03/11/2018  . Lower abdominal pain 03/11/2018  . Dysmenorrhea 10/11/2015  . Infertility, female 08/16/2015  . PCOS (polycystic ovarian syndrome) 07/19/2015  . Menorrhagia 04/08/2015  . Exercise-induced asthma 04/01/2012    Past Surgical History:  Procedure Laterality Date  . NO PAST SURGERIES       OB History    Gravida  2   Para  1   Term  1   Preterm      AB      Living  1     SAB      TAB      Ectopic      Multiple      Live Births  1           Family History  Problem Relation Age of Onset  . Alcohol abuse Father   . Hyperlipidemia Father   . Diabetes Paternal Grandmother     Social History   Tobacco Use  . Smoking status: Never Smoker  . Smokeless tobacco: Never Used    Substance Use Topics  . Alcohol use: No  . Drug use: No    Home Medications Prior to Admission medications   Medication Sig Start Date End Date Taking? Authorizing Provider  Prenatal Vit-Fe Fumarate-FA (PREPLUS) 27-1 MG TABS Take 1 tablet by mouth daily.   Yes [provider]  albuterol (PROVENTIL HFA) 108 (90 Base) MCG/ACT inhaler Inhale 2 puffs into the lungs every 6 (six) hours as needed for wheezing. 03/11/18 03/11/19  [place], Betty G, MD  metroNIDAZOLE (FLAGYL) 500 MG tablet Take 1 tablet (500 mg total) by mouth 2 (two) times daily. Patient not taking: Reported on 01/10/2020 08/19/19   Lajean Manes, CNM  promethazine (PHENERGAN) 25 MG tablet Take 1 tablet (25 mg total) by mouth every 6 (six) hours as needed for nausea or vomiting. Patient not taking: Reported on 01/10/2020 08/31/19   Woodroe Mode, MD  ranitidine (ZANTAC) 150 MG tablet Take 1 tablet (150 mg total) by mouth 2 (two) times daily. Patient not taking: Reported on 01/10/2020 04/15/18   Rodell Perna A, PA-C    Allergies    Patient has no known allergies.  Review of Systems   Review of Systems Ten systems are reviewed and are  negative for acute change except as noted in the HPI  Physical Exam Updated Vital Signs BP (!) 113/56 (BP Location: Left Arm)   Pulse 96   Temp 98.2 F (36.8 C) (Oral)   Resp 18   Ht 5' 5.5" (1.664 m)   Wt 87.1 kg   LMP 06/27/2019 (Approximate)   SpO2 100%   BMI 31.46 kg/m   Physical Exam Constitutional:      General: She is not in acute distress.    Appearance: Normal appearance. She is well-developed. She is not ill-appearing or diaphoretic.  HENT:     Head: Normocephalic and atraumatic.     Right Ear: External ear normal.     Left Ear: External ear normal.     Nose: Nose normal.  Eyes:     General: Vision grossly intact. Gaze aligned appropriately.     Pupils: Pupils are equal, round, and reactive to light.  Neck:     Trachea: Trachea and phonation normal. No  tracheal deviation.  Cardiovascular:     Pulses:          Dorsalis pedis pulses are 2+ on the right side and 2+ on the left side.       Posterior tibial pulses are 2+ on the right side and 2+ on the left side.  Pulmonary:     Effort: Pulmonary effort is normal. No respiratory distress.  Abdominal:     General: There is no distension.     Palpations: Abdomen is soft.     Tenderness: There is no abdominal tenderness. There is no guarding or rebound.  Musculoskeletal:        General: Normal range of motion.     Cervical back: Normal range of motion.     Right hip: Normal range of motion. Normal strength.     Left hip: Normal range of motion. Normal strength.     Right knee: Normal.     Left knee: Normal.     Right lower leg: Normal.     Left lower leg: Normal.     Right ankle: Normal.     Right Achilles Tendon: Normal.     Left ankle: Normal.     Left Achilles Tendon: Normal.     Right foot: Normal range of motion. Tenderness present. No swelling, deformity or bony tenderness.     Left foot: Normal.       Feet:  Feet:     Right foot:     Protective Sensation: 5 sites tested. 5 sites sensed.     Skin integrity: Skin integrity normal.     Left foot:     Protective Sensation: 5 sites tested. 5 sites sensed.     Skin integrity: Skin integrity normal.     Comments: Tenderness along the right fifth metatarsal without overlying skin changes. Skin:    General: Skin is warm and dry.  Neurological:     Mental Status: She is alert.     GCS: GCS eye subscore is 4. GCS verbal subscore is 5. GCS motor subscore is 6.     Comments: Speech is clear and goal oriented, follows commands Major Cranial nerves without deficit, no facial droop Moves extremities without ataxia, coordination intact  Psychiatric:        Behavior: Behavior normal.     ED Results / Procedures / Treatments   Labs (all labs ordered are listed, but only abnormal results are displayed) Labs Reviewed - No data to  display  EKG None  Radiology DG Foot Complete Right  Result Date: 01/10/2020 CLINICAL DATA:  Twenty-five weeks pregnant.  Foot pain EXAM: RIGHT FOOT COMPLETE - 3+ VIEW COMPARISON:  None. FINDINGS: No fracture or malalignment. Soft tissues are unremarkable. Joint spaces are maintained. IMPRESSION: No acute osseous abnormality Electronically Signed   By: Jasmine Pang M.D.   On: 01/10/2020 22:40    Procedures Procedures (including critical care time)  Medications Ordered in ED Medications - No data to display  ED Course  I have reviewed the triage vital signs and the nursing notes.  Pertinent labs & imaging results that were available during my care of the patient were reviewed by me and considered in my medical decision making (see chart for details).  Clinical Course as of Jan 09 2299  Sun Jan 10, 2020  2133 Imaging system down   [BM]    Clinical Course User Index [BM] Elizabeth Palau   MDM Rules/Calculators/A&P                     29 year old female history of asthma, currently [redacted] weeks pregnant presents for right foot injury while stepping out of her home today.  She has tenderness along the fifth metatarsal without swelling, deformity or overlying skin changes.  Capillary refill and sensation intact to all toes, strong and equal pedal pulses.  Compartments soft.  Range of motion at the toes, ankle knee and hip intact, only minimal increase in pain with toe movement.  Neurovascular intact.  No evidence of cellulitis, septic arthritis, DVT, compartment syndrome, gross ligamentous laxity, bony deformity or other emergent pathologies at this time.  Will obtain x-ray of the right foot for evaluation of possible osseous injury.  Additionally as patient is [redacted] weeks pregnant and suffered a fall [facility] was paged to evaluate patient.  Patient denies any abdominal complaints or other injury today.  No history of head injury, neck pain, back pain, chest pain, abdominal pain,  neurologic complaint or pain to the other 3 extremities. - Patient was evaluated by Baxter Hire RN with [facility].  Patient cleared from an OB standpoint at 9:25 PM.  There is a slight delay in obtaining patient's right foot x-ray as PACS system was down.  DG right foot:    IMPRESSION:  No acute osseous abnormality  I have personally reviewed patient's right foot x-ray, per my interpretation no obvious fracture or dislocation.  I agree with radiologist interpretation. - Suspect possible strain versus strain of the right foot, patient aware of possibility of occult fracture and informed to follow-up with PCP and orthopedist.  Referral given to Dr. Eulah Pont for follow-up as needed.  RICE therapy discussed.  Patient was given postop shoe and crutches.  At this time there does not appear to be any evidence of an acute emergency medical condition and the patient appears stable for discharge with appropriate outpatient follow up. Diagnosis was discussed with patient who verbalizes understanding of care plan and is agreeable to discharge. I have discussed return precautions with patient and husband who verbalizes understanding of return precautions. Patient encouraged to follow-up with their PCP and Ortho all questions answered.   Note: Portions of this report may have been transcribed using voice recognition software. Every effort was made to ensure accuracy; however, inadvertent computerized transcription errors may still be present. Final Clinical Impression(s) / ED Diagnoses Final diagnoses:  Injury  Sprain of right foot, initial encounter    Rx / DC Orders ED Discharge Orders  None       Elizabeth Palau 01/10/20 2301    Little, Ambrose Finland, MD 01/14/20 [phone]

## 2020-01-10 NOTE — Progress Notes (Signed)
OB RR RN called to assess [redacted]w[redacted]d G2P1 presenting with foot pain.  Pt reports stumbling on the side of her foot but not falling all the way to the ground.  Pt denies hitting her belly on anything.  Denies bleeding, leaking of fluid and contractions.  Good fetal movement reported and observed by RN.  FHR 140 by doppler. Blood type O pos. Notified Dr. Macon Large of pt, MD cleared from [facility] standpoint.

## 2020-01-10 NOTE — Discharge Instructions (Addendum)
You have been diagnosed today with Right Foot Sprain.  At this time there does not appear to be the presence of an emergent medical condition, however there is always the potential for conditions to change. Please read and follow the below instructions.  Please return to the Emergency Department immediately for any new or worsening symptoms. Please be sure to follow up with your Primary Care Provider within one week regarding your visit today; please call their office to schedule an appointment even if you are feeling better for a follow-up visit. You may use the crutches and postop shoe provided to you today to help protect your foot from further injury.  Please use rest, ice and elevation to help with pain.  You may call the orthopedist Dr. Eulah Pont on your discharge paperwork for a follow-up appointment.  As we discussed soft tissue injuries will not appear on an x-ray, sprains or strains are likely.  Additionally unseen fractures may be present so follow-up imaging may be needed if your symptoms persist.  Get help right away if: You develop severe numbness or tingling in your foot. Your foot turns blue, white, or gray, and it feels cold. You have any new/concerning or worsening of symptoms  Please read the additional information packets attached to your discharge summary.  Do not take your medicine if  develop an itchy rash, swelling in your mouth or lips, or difficulty breathing; call 911 and seek immediate emergency medical attention if this occurs.  Note: Portions of this text may have been transcribed using voice recognition software. Every effort was made to ensure accuracy; however, inadvertent computerized transcription errors may still be present.

## 2020-01-29 DIAGNOSIS — N760 Acute vaginitis: Secondary | ICD-10-CM | POA: Diagnosis not present

## 2020-02-02 DIAGNOSIS — Z3A28 28 weeks gestation of pregnancy: Secondary | ICD-10-CM | POA: Diagnosis not present

## 2020-02-02 DIAGNOSIS — Z3689 Encounter for other specified antenatal screening: Secondary | ICD-10-CM | POA: Diagnosis not present

## 2020-03-03 DIAGNOSIS — O99119 Other diseases of the blood and blood-forming organs and certain disorders involving the immune mechanism complicating pregnancy, unspecified trimester: Secondary | ICD-10-CM | POA: Diagnosis not present

## 2020-03-03 DIAGNOSIS — Z3686 Encounter for antenatal screening for cervical length: Secondary | ICD-10-CM | POA: Diagnosis not present

## 2020-03-03 DIAGNOSIS — Z3A30 30 weeks gestation of pregnancy: Secondary | ICD-10-CM | POA: Diagnosis not present

## 2020-03-03 DIAGNOSIS — D696 Thrombocytopenia, unspecified: Secondary | ICD-10-CM | POA: Diagnosis not present

## 2020-03-03 DIAGNOSIS — O99113 Other diseases of the blood and blood-forming organs and certain disorders involving the immune mechanism complicating pregnancy, third trimester: Secondary | ICD-10-CM | POA: Diagnosis not present

## 2020-03-17 DIAGNOSIS — O99119 Other diseases of the blood and blood-forming organs and certain disorders involving the immune mechanism complicating pregnancy, unspecified trimester: Secondary | ICD-10-CM | POA: Diagnosis not present

## 2020-03-17 DIAGNOSIS — D696 Thrombocytopenia, unspecified: Secondary | ICD-10-CM | POA: Diagnosis not present

## 2020-03-25 DIAGNOSIS — O99113 Other diseases of the blood and blood-forming organs and certain disorders involving the immune mechanism complicating pregnancy, third trimester: Secondary | ICD-10-CM | POA: Diagnosis not present

## 2020-03-25 DIAGNOSIS — Z3A35 35 weeks gestation of pregnancy: Secondary | ICD-10-CM | POA: Diagnosis not present

## 2020-03-25 DIAGNOSIS — O99013 Anemia complicating pregnancy, third trimester: Secondary | ICD-10-CM | POA: Diagnosis not present

## 2020-03-25 DIAGNOSIS — D509 Iron deficiency anemia, unspecified: Secondary | ICD-10-CM | POA: Diagnosis not present

## 2020-03-25 DIAGNOSIS — D696 Thrombocytopenia, unspecified: Secondary | ICD-10-CM | POA: Diagnosis not present

## 2020-04-06 DIAGNOSIS — O3663X0 Maternal care for excessive fetal growth, third trimester, not applicable or unspecified: Secondary | ICD-10-CM | POA: Diagnosis not present

## 2020-04-06 DIAGNOSIS — O26843 Uterine size-date discrepancy, third trimester: Secondary | ICD-10-CM | POA: Diagnosis not present

## 2020-04-06 DIAGNOSIS — D696 Thrombocytopenia, unspecified: Secondary | ICD-10-CM | POA: Diagnosis not present

## 2020-04-06 DIAGNOSIS — Z3A37 37 weeks gestation of pregnancy: Secondary | ICD-10-CM | POA: Diagnosis not present

## 2020-04-06 DIAGNOSIS — O99113 Other diseases of the blood and blood-forming organs and certain disorders involving the immune mechanism complicating pregnancy, third trimester: Secondary | ICD-10-CM | POA: Diagnosis not present

## 2020-04-06 DIAGNOSIS — Z3688 Encounter for antenatal screening for fetal macrosomia: Secondary | ICD-10-CM | POA: Diagnosis not present

## 2020-04-18 DIAGNOSIS — O9902 Anemia complicating childbirth: Secondary | ICD-10-CM | POA: Diagnosis not present

## 2020-04-18 DIAGNOSIS — O99824 Streptococcus B carrier state complicating childbirth: Secondary | ICD-10-CM | POA: Diagnosis not present

## 2020-04-18 DIAGNOSIS — O9912 Other diseases of the blood and blood-forming organs and certain disorders involving the immune mechanism complicating childbirth: Secondary | ICD-10-CM | POA: Diagnosis not present

## 2020-04-18 DIAGNOSIS — O3663X0 Maternal care for excessive fetal growth, third trimester, not applicable or unspecified: Secondary | ICD-10-CM | POA: Diagnosis not present

## 2020-04-18 DIAGNOSIS — D649 Anemia, unspecified: Secondary | ICD-10-CM | POA: Diagnosis not present

## 2020-04-18 DIAGNOSIS — O9952 Diseases of the respiratory system complicating childbirth: Secondary | ICD-10-CM | POA: Diagnosis not present

## 2020-04-18 DIAGNOSIS — D696 Thrombocytopenia, unspecified: Secondary | ICD-10-CM | POA: Diagnosis not present

## 2020-04-18 DIAGNOSIS — J45909 Unspecified asthma, uncomplicated: Secondary | ICD-10-CM | POA: Diagnosis not present

## 2020-04-18 DIAGNOSIS — Z3A39 39 weeks gestation of pregnancy: Secondary | ICD-10-CM | POA: Diagnosis not present

## 2020-04-19 DIAGNOSIS — Z3A39 39 weeks gestation of pregnancy: Secondary | ICD-10-CM | POA: Diagnosis not present

## 2020-04-19 DIAGNOSIS — O3663X0 Maternal care for excessive fetal growth, third trimester, not applicable or unspecified: Secondary | ICD-10-CM | POA: Diagnosis not present

## 2021-02-01 ENCOUNTER — Ambulatory Visit
Admission: EM | Admit: 2021-02-01 | Discharge: 2021-02-01 | Disposition: A | Discharge status: Home / Self Care | Payer: No Typology Code available for payment source

## 2021-02-01 ENCOUNTER — Other Ambulatory Visit: Payer: Self-pay

## 2021-02-01 DIAGNOSIS — J3089 Other allergic rhinitis: Secondary | ICD-10-CM | POA: Diagnosis not present

## 2021-02-01 DIAGNOSIS — J069 Acute upper respiratory infection, unspecified: Secondary | ICD-10-CM | POA: Diagnosis not present

## 2021-02-01 DIAGNOSIS — Z20822 Contact with and (suspected) exposure to covid-19: Secondary | ICD-10-CM | POA: Diagnosis not present

## 2021-02-01 NOTE — ED Provider Notes (Signed)
[facility]    CSN: [id number] Arrival date & time: 02/01/21  1235      History   Chief Complaint Chief Complaint  Patient presents with  . Cough    HPI Opal Hogate is a 30 y.o. female.   Patient presenting today with 2-day history of cough, congestion, facial pressure, fatigue.  Denies fever, chills, chest pain, shortness of breath, abdominal pain, nausea vomiting diarrhea.  Does have a history of seasonal allergies, has not been on any regimen for this as she did not know if it was safe while breast-feeding.  Not taking anything over-the-counter for symptoms.  No new sick contacts.     Past Medical History:  Diagnosis Date  . Asthma   . Ovarian cyst     Patient Active Problem List   Diagnosis Date Noted  . Encounter for initial prescription of contraceptive pills 03/11/2018  . Lower abdominal pain 03/11/2018  . Dysmenorrhea 10/11/2015  . Infertility, female 08/16/2015  . PCOS (polycystic ovarian syndrome) 07/19/2015  . Menorrhagia 04/08/2015  . Exercise-induced asthma 04/01/2012    Past Surgical History:  Procedure Laterality Date  . NO PAST SURGERIES      OB History    Gravida  2   Para  1   Term  1   Preterm      AB      Living  1     SAB      IAB      Ectopic      Multiple      Live Births  1            Home Medications    Prior to Admission medications   Medication Sig Start Date End Date Taking? Authorizing Provider  Prenatal w/o A Vit-Fe Fum-FA (AZESCHEW PRENATAL/POSTNATAL PO) Take by mouth.   Yes [provider]  albuterol (PROVENTIL HFA) 108 (90 Base) MCG/ACT inhaler Inhale 2 puffs into the lungs every 6 (six) hours as needed for wheezing. 03/11/18 03/11/19  [place], Betty G, MD  Prenatal Vit-Fe Fumarate-FA (PREPLUS) 27-1 MG TABS Take 1 tablet by mouth daily.    [provider]    Family History Family History  Problem Relation Age of Onset  . Alcohol abuse Father   . Hyperlipidemia Father    . Diabetes Paternal Grandmother     Social History Social History   Tobacco Use  . Smoking status: Never Smoker  . Smokeless tobacco: Never Used  Vaping Use  . Vaping Use: Never used  Substance Use Topics  . Alcohol use: No  . Drug use: No     Allergies   Patient has no known allergies.   Review of Systems Review of Systems Per HPI  Physical Exam Triage Vital Signs ED Triage Vitals  Enc Vitals Group     BP 02/01/21 1253 106/73     Pulse Rate 02/01/21 1253 (!) 108     Resp 02/01/21 1253 18     Temp 02/01/21 1253 99.3 F (37.4 C)     Temp Source 02/01/21 1253 Oral     SpO2 02/01/21 1253 98 %     Weight --      Height --      Head Circumference --      Peak Flow --      Pain Score 02/01/21 1300 8     Pain Loc --      Pain Edu? --      Excl. in GC? --  No data found.  Updated Vital Signs BP 106/73 (BP Location: Left Arm)   Pulse (!) 108   Temp 99.3 F (37.4 C) (Oral)   Resp 18   SpO2 98%   Breastfeeding Yes   Visual Acuity Right Eye Distance:   Left Eye Distance:   Bilateral Distance:    Right Eye Near:   Left Eye Near:    Bilateral Near:     Physical Exam Vitals and nursing note reviewed.  Constitutional:      Appearance: Normal appearance. She is not ill-appearing.  HENT:     Head: Atraumatic.     Right Ear: Tympanic membrane normal.     Left Ear: Tympanic membrane normal.     Nose: Rhinorrhea present.     Comments: Nasal turbinates boggy and erythematous    Mouth/Throat:     Mouth: Mucous membranes are moist.     Pharynx: Posterior oropharyngeal erythema present.  Eyes:     Extraocular Movements: Extraocular movements intact.     Conjunctiva/sclera: Conjunctivae normal.  Cardiovascular:     Rate and Rhythm: Normal rate and regular rhythm.     Heart sounds: Normal heart sounds.  Pulmonary:     Effort: Pulmonary effort is normal. No respiratory distress.     Breath sounds: Normal breath sounds. No wheezing or rales.  Abdominal:      General: Bowel sounds are normal. There is no distension.     Palpations: Abdomen is soft.     Tenderness: There is no abdominal tenderness. There is no guarding.  Musculoskeletal:        General: Normal range of motion.     Cervical back: Normal range of motion and neck supple.  Skin:    General: Skin is warm and dry.  Neurological:     Mental Status: She is alert and oriented to person, place, and time.  Psychiatric:        Mood and Affect: Mood normal.        Thought Content: Thought content normal.        Judgment: Judgment normal.      UC Treatments / Results  Labs (all labs ordered are listed, but only abnormal results are displayed) Labs Reviewed  COVID-19, FLU A+B NAA    EKG   Radiology No results found.  Procedures Procedures (including critical care time)  Medications Ordered in UC Medications - No data to display  Initial Impression / Assessment and Plan / UC Course  I have reviewed the triage vital signs and the nursing notes.  Pertinent labs & imaging results that were available during my care of the patient were reviewed by me and considered in my medical decision making (see chart for details).     Exam reassuring, COVID PCR pending, suspect allergies versus viral versus both.  Discussed that it is in fact safe to take Zyrtec and Flonase while breast-feeding, also discussed Sudafed, Delsym for further symptomatic relief.  Work note given.  Return for acutely worsening symptoms.  Final Clinical Impressions(s) / UC Diagnoses   Final diagnoses:  Encounter for screening laboratory testing for COVID-19 virus  Viral URI with cough  Seasonal allergic rhinitis due to other allergic trigger   Discharge Instructions   None    ED Prescriptions    None     PDMP not reviewed this encounter.   Particia Nearing, [place] 02/01/21 1407

## 2021-02-01 NOTE — ED Triage Notes (Signed)
Pt c/o cough, nasal congestion, headache, and sore throat since Monday.

## 2021-02-02 LAB — COVID-19, FLU A+B NAA
Influenza A, NAA: NOT DETECTED
Influenza B, NAA: NOT DETECTED
SARS-CoV-2, NAA: DETECTED — AB

## 2021-05-01 ENCOUNTER — Ambulatory Visit (HOSPITAL_COMMUNITY)
Admission: EM | Admit: 2021-05-01 | Discharge: 2021-05-01 | Disposition: A | Discharge status: Home / Self Care | Payer: No Typology Code available for payment source | Attending: General Surgery | Admitting: General Surgery

## 2021-05-01 ENCOUNTER — Emergency Department (HOSPITAL_COMMUNITY): Payer: No Typology Code available for payment source | Admitting: Certified Registered Nurse Anesthetist

## 2021-05-01 ENCOUNTER — Other Ambulatory Visit: Payer: Self-pay

## 2021-05-01 ENCOUNTER — Emergency Department (HOSPITAL_COMMUNITY): Payer: No Typology Code available for payment source

## 2021-05-01 ENCOUNTER — Encounter (HOSPITAL_COMMUNITY): Payer: Self-pay

## 2021-05-01 ENCOUNTER — Encounter (HOSPITAL_COMMUNITY)
Admission: EM | Disposition: A | Discharge status: Home / Self Care | Payer: Self-pay | Source: Home / Self Care | Attending: Emergency Medicine

## 2021-05-01 ENCOUNTER — Inpatient Hospital Stay: Admit: 2021-05-01 | Payer: No Typology Code available for payment source | Admitting: General Surgery

## 2021-05-01 DIAGNOSIS — K3589 Other acute appendicitis without perforation or gangrene: Secondary | ICD-10-CM | POA: Insufficient documentation

## 2021-05-01 DIAGNOSIS — Z20822 Contact with and (suspected) exposure to covid-19: Secondary | ICD-10-CM | POA: Insufficient documentation

## 2021-05-01 DIAGNOSIS — K381 Appendicular concretions: Secondary | ICD-10-CM | POA: Insufficient documentation

## 2021-05-01 DIAGNOSIS — R109 Unspecified abdominal pain: Secondary | ICD-10-CM | POA: Diagnosis not present

## 2021-05-01 DIAGNOSIS — Z791 Long term (current) use of non-steroidal anti-inflammatories (NSAID): Secondary | ICD-10-CM | POA: Diagnosis not present

## 2021-05-01 DIAGNOSIS — Z8349 Family history of other endocrine, nutritional and metabolic diseases: Secondary | ICD-10-CM | POA: Diagnosis not present

## 2021-05-01 DIAGNOSIS — Z79899 Other long term (current) drug therapy: Secondary | ICD-10-CM | POA: Diagnosis not present

## 2021-05-01 DIAGNOSIS — K353 Acute appendicitis with localized peritonitis, without perforation or gangrene: Secondary | ICD-10-CM

## 2021-05-01 DIAGNOSIS — Z833 Family history of diabetes mellitus: Secondary | ICD-10-CM | POA: Diagnosis not present

## 2021-05-01 HISTORY — PX: LAPAROSCOPIC APPENDECTOMY: SHX408

## 2021-05-01 LAB — COMPREHENSIVE METABOLIC PANEL
ALT: 12 U/L (ref 0–44)
AST: 13 U/L — ABNORMAL LOW (ref 15–41)
Albumin: 4.1 g/dL (ref 3.5–5.0)
Alkaline Phosphatase: 97 U/L (ref 38–126)
Anion gap: 7 (ref 5–15)
BUN: 8 mg/dL (ref 6–20)
CO2: 25 mmol/L (ref 22–32)
Calcium: 9.2 mg/dL (ref 8.9–10.3)
Chloride: 107 mmol/L (ref 98–111)
Creatinine, Ser: 0.54 mg/dL (ref 0.44–1.00)
GFR, Estimated: >60 mL/min (ref >60)
Glucose, Bld: 93 mg/dL (ref 70–99)
Potassium: 3.6 mmol/L (ref 3.5–5.1)
Sodium: 139 mmol/L (ref 135–145)
Total Bilirubin: 1.2 mg/dL (ref 0.3–1.2)
Total Protein: 7.4 g/dL (ref 6.5–8.1)

## 2021-05-01 LAB — URINALYSIS, ROUTINE W REFLEX MICROSCOPIC
Bilirubin Urine: NEGATIVE
Glucose, UA: NEGATIVE mg/dL
Hgb urine dipstick: NEGATIVE
Ketones, ur: 5 mg/dL — AB
Leukocytes,Ua: NEGATIVE
Nitrite: NEGATIVE
Protein, ur: NEGATIVE mg/dL
Specific Gravity, Urine: 1.025 (ref 1.005–1.030)
pH: 7 (ref 5.0–8.0)

## 2021-05-01 LAB — CBC
HCT: 40 % (ref 36.0–46.0)
Hemoglobin: 12.7 g/dL (ref 12.0–15.0)
MCH: 27.6 pg (ref 26.0–34.0)
MCHC: 31.8 g/dL (ref 30.0–36.0)
MCV: 87 fL (ref 80.0–100.0)
Platelets: 128 10*3/uL — ABNORMAL LOW (ref 150–400)
RBC: 4.6 MIL/uL (ref 3.87–5.11)
RDW: 14.1 % (ref 11.5–15.5)
WBC: 10.8 10*3/uL — ABNORMAL HIGH (ref 4.0–10.5)
nRBC: 0 % (ref 0.0–0.2)

## 2021-05-01 LAB — I-STAT BETA HCG BLOOD, ED (MC, WL, AP ONLY): I-stat hCG, quantitative: <5 m[IU]/mL (ref <5)

## 2021-05-01 LAB — RESP PANEL BY RT-PCR (FLU A&B, COVID) ARPGX2
Influenza A by PCR: NEGATIVE
Influenza B by PCR: NEGATIVE
SARS Coronavirus 2 by RT PCR: NEGATIVE

## 2021-05-01 LAB — LIPASE, BLOOD: Lipase: 21 U/L (ref 11–51)

## 2021-05-01 SURGERY — APPENDECTOMY, LAPAROSCOPIC
Anesthesia: General | Site: Abdomen

## 2021-05-01 MED ORDER — SODIUM CHLORIDE 0.9 % IV BOLUS
1000.0000 mL | Freq: Once | INTRAVENOUS | Status: AC
  Administered 2021-05-01: 1000 mL via INTRAVENOUS

## 2021-05-01 MED ORDER — FENTANYL CITRATE (PF) 100 MCG/2ML IJ SOLN: 25.0000 ug | INTRAMUSCULAR | Status: DC | PRN

## 2021-05-01 MED ORDER — OXYCODONE HCL 5 MG PO TABS
5.0000 mg | ORAL_TABLET | Freq: Once | ORAL | Status: AC
Start: 2021-05-01 — End: 2021-05-01
  Administered 2021-05-01: 5 mg via ORAL

## 2021-05-01 MED ORDER — MIDAZOLAM HCL 2 MG/2ML IJ SOLN
INTRAMUSCULAR | Status: AC
  Filled 2021-05-01: qty 2

## 2021-05-01 MED ORDER — LIDOCAINE 2% (20 MG/ML) 5 ML SYRINGE
INTRAMUSCULAR | Status: DC | PRN
  Administered 2021-05-01: 60 mg via INTRAVENOUS

## 2021-05-01 MED ORDER — ONDANSETRON HCL 4 MG/2ML IJ SOLN
INTRAMUSCULAR | Status: AC
  Filled 2021-05-01: qty 2

## 2021-05-01 MED ORDER — MEPERIDINE HCL 50 MG/ML IJ SOLN: 6.2500 mg | INTRAMUSCULAR | Status: DC | PRN

## 2021-05-01 MED ORDER — BUPIVACAINE-EPINEPHRINE 0.25% -1:200000 IJ SOLN
INTRAMUSCULAR | Status: DC | PRN
  Administered 2021-05-01: 50 mL

## 2021-05-01 MED ORDER — SUGAMMADEX SODIUM 200 MG/2ML IV SOLN
INTRAVENOUS | Status: DC | PRN
  Administered 2021-05-01: 300 mg via INTRAVENOUS

## 2021-05-01 MED ORDER — MIDAZOLAM HCL 5 MG/5ML IJ SOLN
INTRAMUSCULAR | Status: DC | PRN
  Administered 2021-05-01: 2 mg via INTRAVENOUS

## 2021-05-01 MED ORDER — ONDANSETRON HCL 4 MG/2ML IJ SOLN
INTRAMUSCULAR | Status: DC | PRN
  Administered 2021-05-01: 4 mg via INTRAVENOUS

## 2021-05-01 MED ORDER — ONDANSETRON HCL 4 MG PO TABS: 4.0000 mg | ORAL_TABLET | Freq: Three times a day (TID) | ORAL | 0 refills | Status: AC | PRN

## 2021-05-01 MED ORDER — HYDROMORPHONE HCL 1 MG/ML IJ SOLN
1.0000 mg | Freq: Once | INTRAMUSCULAR | Status: AC
  Administered 2021-05-01: 1 mg via INTRAVENOUS
  Filled 2021-05-01: qty 1

## 2021-05-01 MED ORDER — BUPIVACAINE-EPINEPHRINE 0.25% -1:200000 IJ SOLN
INTRAMUSCULAR | Status: AC
  Filled 2021-05-01: qty 1

## 2021-05-01 MED ORDER — OXYCODONE HCL 5 MG PO TABS: 5.0000 mg | ORAL_TABLET | ORAL | 0 refills | Status: AC | PRN

## 2021-05-01 MED ORDER — PHENYLEPHRINE 40 MCG/ML (10ML) SYRINGE FOR IV PUSH (FOR BLOOD PRESSURE SUPPORT)
PREFILLED_SYRINGE | INTRAVENOUS | Status: DC | PRN
  Administered 2021-05-01: 80 ug via INTRAVENOUS
  Administered 2021-05-01: 120 ug via INTRAVENOUS

## 2021-05-01 MED ORDER — PIPERACILLIN-TAZOBACTAM 3.375 G IVPB
3.3750 g | Freq: Once | INTRAVENOUS | Status: AC
  Administered 2021-05-01: 3.375 g via INTRAVENOUS
  Filled 2021-05-01: qty 50

## 2021-05-01 MED ORDER — PROPOFOL 10 MG/ML IV BOLUS
INTRAVENOUS | Status: AC
  Filled 2021-05-01: qty 20

## 2021-05-01 MED ORDER — OXYCODONE HCL 5 MG PO TABS
ORAL_TABLET | ORAL | Status: AC
  Filled 2021-05-01: qty 1

## 2021-05-01 MED ORDER — HYDROMORPHONE HCL 1 MG/ML IJ SOLN
1.0000 mg | Freq: Once | INTRAMUSCULAR | Status: AC
Start: 2021-05-01 — End: 2021-05-01
  Administered 2021-05-01: 1 mg via INTRAVENOUS
  Filled 2021-05-01: qty 1

## 2021-05-01 MED ORDER — FENTANYL CITRATE (PF) 250 MCG/5ML IJ SOLN
INTRAMUSCULAR | Status: AC
  Filled 2021-05-01: qty 5

## 2021-05-01 MED ORDER — ONDANSETRON HCL 4 MG/2ML IJ SOLN
4.0000 mg | Freq: Once | INTRAMUSCULAR | Status: AC
  Administered 2021-05-01: 4 mg via INTRAVENOUS
  Filled 2021-05-01: qty 2

## 2021-05-01 MED ORDER — PHENYLEPHRINE 40 MCG/ML (10ML) SYRINGE FOR IV PUSH (FOR BLOOD PRESSURE SUPPORT)
PREFILLED_SYRINGE | INTRAVENOUS | Status: AC
  Filled 2021-05-01: qty 30

## 2021-05-01 MED ORDER — LACTATED RINGERS IR SOLN
Status: DC | PRN
  Administered 2021-05-01: 1000 mL

## 2021-05-01 MED ORDER — DEXAMETHASONE SODIUM PHOSPHATE 10 MG/ML IJ SOLN
INTRAMUSCULAR | Status: DC | PRN
  Administered 2021-05-01: 5 mg via INTRAVENOUS

## 2021-05-01 MED ORDER — FENTANYL CITRATE (PF) 100 MCG/2ML IJ SOLN
INTRAMUSCULAR | Status: DC | PRN
  Administered 2021-05-01 (×2): 50 ug via INTRAVENOUS
  Administered 2021-05-01: 100 ug via INTRAVENOUS

## 2021-05-01 MED ORDER — PROMETHAZINE HCL 25 MG/ML IJ SOLN: 6.2500 mg | INTRAMUSCULAR | Status: DC | PRN

## 2021-05-01 MED ORDER — LACTATED RINGERS IV SOLN: INTRAVENOUS | Status: DC

## 2021-05-01 MED ORDER — ORAL CARE MOUTH RINSE: 15.0000 mL | Freq: Once | OROMUCOSAL | Status: AC

## 2021-05-01 MED ORDER — CHLORHEXIDINE GLUCONATE 0.12 % MT SOLN
15.0000 mL | Freq: Once | OROMUCOSAL | Status: AC
  Administered 2021-05-01: 15 mL via OROMUCOSAL

## 2021-05-01 MED ORDER — IOHEXOL 350 MG/ML SOLN
100.0000 mL | Freq: Once | INTRAVENOUS | Status: AC | PRN
  Administered 2021-05-01: 80 mL via INTRAVENOUS

## 2021-05-01 MED ORDER — KETOROLAC TROMETHAMINE 15 MG/ML IJ SOLN
INTRAMUSCULAR | Status: DC | PRN
  Administered 2021-05-01: 15 mg via INTRAVENOUS

## 2021-05-01 MED ORDER — 0.9 % SODIUM CHLORIDE (POUR BTL) OPTIME
TOPICAL | Status: DC | PRN
  Administered 2021-05-01: 1000 mL

## 2021-05-01 MED ORDER — ROCURONIUM BROMIDE 10 MG/ML (PF) SYRINGE
PREFILLED_SYRINGE | INTRAVENOUS | Status: DC | PRN
  Administered 2021-05-01: 60 mg via INTRAVENOUS

## 2021-05-01 MED ORDER — PROPOFOL 10 MG/ML IV BOLUS
INTRAVENOUS | Status: DC | PRN
  Administered 2021-05-01: 150 mg via INTRAVENOUS

## 2021-05-01 SURGICAL SUPPLY — 31 items
APPLIER CLIP 5 13 M/L LIGAMAX5 (MISCELLANEOUS)
BAG COUNTER SPONGE SURGICOUNT (BAG) IMPLANT
CABLE HIGH FREQUENCY MONO STRZ (ELECTRODE) IMPLANT
CLIP APPLIE 5 13 M/L LIGAMAX5 (MISCELLANEOUS) IMPLANT
COVER SURGICAL LIGHT HANDLE (MISCELLANEOUS) ×2 IMPLANT
CUTTER FLEX LINEAR 45M (STAPLE) ×2 IMPLANT
DECANTER SPIKE VIAL GLASS SM (MISCELLANEOUS) ×2 IMPLANT
GLOVE SRG 8 PF TXTR STRL LF DI (GLOVE) ×1 IMPLANT
GLOVE SURG POLY ORTHO LF SZ7.5 (GLOVE) ×2 IMPLANT
GLOVE SURG UNDER POLY LF SZ8 (GLOVE) ×1
GOWN STRL REUS W/TWL XL LVL3 (GOWN DISPOSABLE) ×4 IMPLANT
GRASPER SUT TROCAR 14GX15 (MISCELLANEOUS) IMPLANT
IV LACTATED RINGERS 1000ML (IV SOLUTION) ×2 IMPLANT
KIT BASIN OR (CUSTOM PROCEDURE TRAY) ×2 IMPLANT
KIT TURNOVER KIT A (KITS) ×2 IMPLANT
POUCH RETRIEVAL ECOSAC 10 (ENDOMECHANICALS) ×1 IMPLANT
POUCH RETRIEVAL ECOSAC 10MM (ENDOMECHANICALS) ×1
RELOAD 45 VASCULAR/THIN (ENDOMECHANICALS) ×2 IMPLANT
RELOAD STAPLE TA45 3.5 REG BLU (ENDOMECHANICALS) IMPLANT
SET IRRIG TUBING LAPAROSCOPIC (IRRIGATION / IRRIGATOR) ×2 IMPLANT
SET TUBE SMOKE EVAC HIGH FLOW (TUBING) ×2 IMPLANT
SHEARS HARMONIC ACE PLUS 36CM (ENDOMECHANICALS) ×2 IMPLANT
SLEEVE XCEL OPT CAN 5 100 (ENDOMECHANICALS) ×2 IMPLANT
STRIP CLOSURE SKIN 1/2X4 (GAUZE/BANDAGES/DRESSINGS) IMPLANT
SUT MNCRL AB 4-0 PS2 18 (SUTURE) ×2 IMPLANT
SUT VICRYL 0 TIES 12 18 (SUTURE) IMPLANT
TOWEL OR 17X26 10 PK STRL BLUE (TOWEL DISPOSABLE) ×2 IMPLANT
TRAY FOLEY MTR SLVR 16FR STAT (SET/KITS/TRAYS/PACK) ×2 IMPLANT
TRAY LAPAROSCOPIC (CUSTOM PROCEDURE TRAY) ×2 IMPLANT
TROCAR BLADELESS OPT 5 100 (ENDOMECHANICALS) ×2 IMPLANT
TROCAR XCEL BLUNT TIP 100MML (ENDOMECHANICALS) ×2 IMPLANT

## 2021-05-01 NOTE — Op Note (Signed)
Kaitlyn Espinoza [id number] [birth year] 05/01/2021  Appendectomy, Lap with laparoscopic bilateral tap block procedure Note  Indications: The patient presented with a history of right-sided abdominal pain. A CT revealed findings consistent with acute appendicitis.  Pre-operative Diagnosis: Acute appendicitis without mention of peritonitis  Post-operative Diagnosis: Same  Surgeon: Gaynelle Adu MD FACS  Assistants: none  Anesthesia: General endotracheal anesthesia   Procedure Details  The patient was seen again in the Holding Room. The risks, benefits, complications, treatment options, and expected outcomes were discussed with the patient and/or family. The possibilities of perforation of viscus, bleeding, recurrent infection, the need for additional procedures, failure to diagnose a condition, and creating a complication requiring transfusion or operation were discussed. There was concurrence with the proposed plan and informed consent was obtained. The site of surgery was properly noted. The patient was taken to Operating Room, identified as Kaitlyn Espinoza and the procedure verified as Appendectomy. A Time Out was held and the above information confirmed.  The patient was placed in the supine position and general anesthesia was induced, along with placement of orogastric tube, SCDs, and a Foley catheter. The abdomen was prepped and draped in a sterile fashion. A 1.5 centimeter infraumbilical incision was made.  The umbilical stalk was elevated, and the midline fascia was incised with a #11 blade.  A Kelly clamp was used to confirm entrance into the peritoneal cavity.  A pursestring suture was passed around the incision with a 0 Vicryl.  A 70mm Hasson was introduced into the abdomen and the tails of the suture were used to hold the Hasson in place.   The pneumoperitoneum was then established to steady pressure of 15 mmHg.  Additional 5 mm cannulas then placed in the left lower quadrant of the abdomen and  the suprapubic region under direct visualization. A careful evaluation of the entire abdomen was carried out. The patient was placed in Trendelenburg and left lateral decubitus position. The small intestines were retracted in the cephalad and left lateral direction away from the pelvis and right lower quadrant.  There was some serous fluid in the pelvis but no purulence.  The patient was found to have an inflamed appendix that was extending up the paracolic gutter. There was no evidence of perforation.  The appendix was carefully dissected. The appendix was was skeletonized with the harmonic scalpel.   The appendix was divided at its base using an endo-GIA stapler with a white load. No appendiceal stump was left in place. The appendix was removed from the abdomen with an Ecco bag through the umbilical port.  There was no evidence of bleeding, leakage, or complication after division of the appendix. Irrigation was also performed and irrigate suctioned from the abdomen as well.  Bilateral laparoscopic tap block was performed for postoperative pain relief  The umbilical port site was closed with the purse string suture. The closure was viewed laparoscopically. There was no residual palpable fascial defect.  The trocar site skin wounds were closed with 4-0 Monocryl. Steri-strips and tegaderms were applied to the skin incisions.  Instrument, sponge, and needle counts were correct at the conclusion of the case.   Findings: The appendix was found to be inflamed. There were not signs of necrosis.  There was not perforation. There was not abscess formation.  Estimated Blood Loss:  Minimal         Drains: none         Specimens: appendix         Complications:  None; patient  tolerated the procedure well.         Disposition: PACU - hemodynamically stable.         Condition: stable  [name]. Andrey Campanile, MD, FACS General, Bariatric, & Minimally Invasive Surgery [facility], [place]

## 2021-05-01 NOTE — ED Notes (Signed)
Pt states last PO intake around 8pm last night.

## 2021-05-01 NOTE — H&P (Signed)
Admission Note  Kaitlyn Espinoza 1991-08-31  [id number].    Requesting MD: Dr. Pieter Partridge Chief Complaint/Reason for Consult: appendicitis HPI:  Patient is an otherwise healthy 30 yo female who presented to [facility] with abdominal pain since yesterday AM. Pain was initially periumbilical and then localized to RLQ. Pain has progressively worsened since onset. Associated nausea without emesis and diarrhea x1 yesterday. Denies fever, chills, chest pain, SOB, urinary symptoms. NKDA. No prior abdominal surgery. No blood thinning medications. Patient works from home and lives with her husband and 2 children. She is currently breastfeeding.   ROS: Review of Systems  Constitutional:  Negative for chills and fever.  Respiratory:  Negative for shortness of breath and wheezing.   Cardiovascular:  Negative for chest pain and palpitations.  Gastrointestinal:  Positive for abdominal pain, diarrhea and nausea. Negative for blood in stool, constipation, melena and vomiting.  Genitourinary:  Negative for dysuria, frequency and urgency.  All other systems reviewed and are negative.  Family History  Problem Relation Age of Onset   Alcohol abuse Father    Hyperlipidemia Father    Diabetes Paternal Grandmother     Past Medical History:  Diagnosis Date   Asthma    Ovarian cyst     Past Surgical History:  Procedure Laterality Date   NO PAST SURGERIES      Social History:  reports that she has never smoked. She has never used smokeless tobacco. She reports that she does not drink alcohol and does not use drugs.  Allergies: No Known Allergies  (Not in a hospital admission)   Blood pressure 111/68, pulse (!) 106, temperature 98.1 F (36.7 C), temperature source Oral, resp. rate 18, height 5\' 5"  (1.651 m), weight 72.6 kg, SpO2 100 %, currently breastfeeding. Physical Exam:  General: pleasant, WD, WN female who is laying in bed in NAD HEENT: head is normocephalic, atraumatic.  Sclera are  noninjected.  PERRL.  Ears and nose without any masses or lesions.  Mouth is pink and moist Heart: regular, rate, and rhythm.  Normal s1,s2. No obvious murmurs, gallops, or rubs noted.  Palpable radial and pedal pulses bilaterally Lungs: CTAB, no wheezes, rhonchi, or rales noted.  Respiratory effort nonlabored Abd: soft, TTP in RLQ and mild peritonitis, ND, no masses, hernias, or organomegaly MS: all 4 extremities are symmetrical with no cyanosis, clubbing, or edema. Skin: warm and dry with no masses, lesions, or rashes Neuro: Cranial nerves 2-12 grossly intact, sensation is normal throughout Psych: A&Ox3 with an appropriate affect.   Results for orders placed or performed during the hospital encounter of 05/01/21 (from the past 48 hour(s))  Lipase, blood     Status: None   Collection Time: 05/01/21 10:42 AM  Result Value Ref Range   Lipase 21 11 - 51 U/L    Comment: Performed at [facility], [address], [place], [place]  Comprehensive metabolic panel     Status: Abnormal   Collection Time: 05/01/21 10:42 AM  Result Value Ref Range   Sodium 139 135 - 145 mmol/L   Potassium 3.6 3.5 - 5.1 mmol/L   Chloride 107 98 - 111 mmol/L   CO2 25 22 - 32 mmol/L   Glucose, Bld 93 70 - 99 mg/dL    Comment: Glucose reference range applies only to samples taken after fasting for at least 8 hours.   BUN 8 6 - 20 mg/dL   Creatinine, Ser 07/01/21 0.44 - 1.00 mg/dL  Calcium 9.2 8.9 - 10.3 mg/dL   Total Protein 7.4 6.5 - 8.1 g/dL   Albumin 4.1 3.5 - 5.0 g/dL   AST 13 (L) 15 - 41 U/L   ALT 12 0 - 44 U/L   Alkaline Phosphatase 97 38 - 126 U/L   Total Bilirubin 1.2 0.3 - 1.2 mg/dL   GFR, Estimated >25 >05 mL/min    Comment: (NOTE) Calculated using the CKD-EPI Creatinine Equation (2021)    Anion gap 7 5 - 15    Comment: Performed at [facility], [address], [place], [place] [address]  CBC     Status: Abnormal   Collection Time: 05/01/21 10:42 AM   Result Value Ref Range   WBC 10.8 (H) 4.0 - 10.5 K/uL   RBC 4.60 3.87 - 5.11 MIL/uL   Hemoglobin 12.7 12.0 - 15.0 g/dL   HCT 34.1 93.7 - 90.2 %   MCV 87.0 80.0 - 100.0 fL   MCH 27.6 26.0 - 34.0 pg   MCHC 31.8 30.0 - 36.0 g/dL   RDW 40.9 73.5 - 32.9 %   Platelets 128 (L) 150 - 400 K/uL    Comment: SPECIMEN CHECKED FOR CLOTS REPEATED TO VERIFY    nRBC 0.0 0.0 - 0.2 %    Comment: Performed at [facility], [address], [place], [place] [address]  Urinalysis, Routine w reflex microscopic Urine, Clean Catch     Status: Abnormal   Collection Time: 05/01/21 10:42 AM  Result Value Ref Range   Color, Urine YELLOW YELLOW   APPearance CLEAR CLEAR   Specific Gravity, Urine 1.025 1.005 - 1.030   pH 7.0 5.0 - 8.0   Glucose, UA NEGATIVE NEGATIVE mg/dL   Hgb urine dipstick NEGATIVE NEGATIVE   Bilirubin Urine NEGATIVE NEGATIVE   Ketones, ur 5 (A) NEGATIVE mg/dL   Protein, ur NEGATIVE NEGATIVE mg/dL   Nitrite NEGATIVE NEGATIVE   Leukocytes,Ua NEGATIVE NEGATIVE    Comment: Performed at [facility], [address] [place], [place] [address]  I-Stat beta hCG blood, ED     Status: None   Collection Time: 05/01/21 10:50 AM  Result Value Ref Range   I-stat hCG, quantitative <5.0 <5 mIU/mL   Comment 3            Comment:   GEST. AGE      CONC.  (mIU/mL)   <=1 WEEK        5 - 50     2 WEEKS       50 - 500     3 WEEKS       100 - 10,000     4 WEEKS     1,000 - 30,000        FEMALE AND NON-PREGNANT FEMALE:     LESS THAN 5 mIU/mL   Resp Panel by RT-PCR (Flu A&B, Covid) Nasopharyngeal Swab     Status: None   Collection Time: 05/01/21 11:34 AM   Specimen: Nasopharyngeal Swab; Nasopharyngeal(NP) swabs in vial transport medium  Result Value Ref Range   SARS Coronavirus 2 by RT PCR NEGATIVE NEGATIVE    Comment: (NOTE) SARS-CoV-2 target nucleic acids are NOT DETECTED.  The SARS-CoV-2 RNA is generally detectable in upper respiratory specimens during the  acute phase of infection. The lowest concentration of SARS-CoV-2 viral copies this assay can detect is 138 copies/mL. A negative result does not preclude SARS-Cov-2 infection and should not be used as the sole basis for treatment  or other patient management decisions. A negative result may occur with  improper specimen collection/handling, submission of specimen other than nasopharyngeal swab, presence of viral mutation(s) within the areas targeted by this assay, and inadequate number of viral copies(<138 copies/mL). A negative result must be combined with clinical observations, patient history, and epidemiological information. The expected result is Negative.  Fact Sheet for Patients:  BloggerCourse.comhttps://www.fda.gov/media/152166/download  Fact Sheet for Healthcare Providers:  SeriousBroker.ithttps://www.fda.gov/media/152162/download  This test is no t yet approved or cleared by the [place] FDA and  has been authorized for detection and/or diagnosis of SARS-CoV-2 by FDA under an Emergency Use Authorization (EUA). This EUA will remain  in effect (meaning this test can be used) for the duration of the COVID-19 declaration under Section 564(b)(1) of the Act, 21 U.S.C.section 360bbb-3(b)(1), unless the authorization is terminated  or revoked sooner.       Influenza A by PCR NEGATIVE NEGATIVE   Influenza B by PCR NEGATIVE NEGATIVE    Comment: (NOTE) The Xpert Xpress SARS-CoV-2/FLU/RSV plus assay is intended as an aid in the diagnosis of influenza from Nasopharyngeal swab specimens and should not be used as a sole basis for treatment. Nasal washings and aspirates are unacceptable for Xpert Xpress SARS-CoV-2/FLU/RSV testing.  Fact Sheet for Patients: BloggerCourse.comhttps://www.fda.gov/media/152166/download  Fact Sheet for Healthcare Providers: SeriousBroker.ithttps://www.fda.gov/media/152162/download  This test is not yet approved or cleared by the [place] FDA and has been authorized for detection and/or diagnosis of  SARS-CoV-2 by FDA under an Emergency Use Authorization (EUA). This EUA will remain in effect (meaning this test can be used) for the duration of the COVID-19 declaration under Section 564(b)(1) of the Act, 21 U.S.C. section 360bbb-3(b)(1), unless the authorization is terminated or revoked.  Performed at [facility], [address] [place], [place] [id number]    CT Abdomen Pelvis W Contrast  Result Date: 05/01/2021 CLINICAL DATA:  30 year old female with right lower quadrant abdominal pain EXAM: CT ABDOMEN AND PELVIS WITH CONTRAST TECHNIQUE: Multidetector CT imaging of the abdomen and pelvis was then performed following the standard protocol during the bolus injection of intravenous contrast. CONTRAST:  80mL OMNIPAQUE IOHEXOL 350 MG/ML SOLN COMPARISON:  Abdominal ultrasound, 04/15/2018. FINDINGS: The examination is mildly degraded secondary to motion artifact. Lower chest: No acute abnormality. Hepatobiliary: No focal liver abnormality is seen. No gallstones, gallbladder wall thickening, or biliary dilatation. Pancreas: Unremarkable. No pancreatic ductal dilatation or surrounding inflammatory changes. Spleen: Normal in size without focal abnormality. Adrenals/Urinary Tract: Adrenal glands are unremarkable. Small right upper pole renal cyst. The kidneys are otherwise normal, without renal calculi or hydronephrosis. Bladder is unremarkable. Stomach/Bowel: Distended appendix, measuring up to 15 mm in greatest axial dimension, with distal radiodense appendicolith. Periappendiceal stranding. No evidence of perforation, focal drainable collection or abscess. Vascular/Lymphatic: No significant vascular findings are present. No enlarged abdominal or pelvic lymph nodes. Reproductive: Uterus and bilateral adnexa are unremarkable. Other: Trace pelvic ascites, likely reactive Musculoskeletal: No acute or significant osseous findings. IMPRESSION: Acute, uncomplicated appendicitis. Findings were  called by telephone at the time of interpretation on 05/01/2021 at 12:38 pm to provider [facility] , who verbally acknowledged these results. Electronically Signed   By: Roanna BanningJon  Mugweru MD   On: 05/01/2021 12:38      Assessment/Plan Acute appendicitis  - CT with dilated appendicitis without concern for abscess or perforation  - WBC 10.8 - exam and hx consistent with acute appendicitis  - plan for laparoscopic appendectomy, possible discharge from PACU post-operatively. If patient requires admission post-op  we will admit for observation   Juliet Rude, [facility] 05/01/2021, 1:55 PM Please see Amion for pager number during day hours 7:00am-4:30pm

## 2021-05-01 NOTE — Transfer of Care (Signed)
Immediate Anesthesia Transfer of Care Note  Patient: Kaitlyn Espinoza  Procedure(s) Performed: APPENDECTOMY LAPAROSCOPIC (Abdomen)  Patient Location: PACU  Anesthesia Type:General  Level of Consciousness: sedated, patient cooperative and responds to stimulation  Airway & Oxygen Therapy: Patient Spontanous Breathing and Patient connected to face mask oxygen  Post-op Assessment: Report given to RN and Post -op Vital signs reviewed and stable  Post vital signs: Reviewed and stable  Last Vitals:  Vitals Value Taken Time  BP 100/53 05/01/21 1537  Temp    Pulse 116 05/01/21 1539  Resp 23 05/01/21 1539  SpO2 100 % 05/01/21 1539  Vitals shown include unvalidated device data.  Last Pain:  Vitals:   05/01/21 1405  TempSrc: Oral  PainSc:          Complications: No notable events documented.

## 2021-05-01 NOTE — Anesthesia Preprocedure Evaluation (Addendum)
Anesthesia Evaluation  Patient identified by MRN, date of birth, ID band Patient awake    Reviewed: Allergy & Precautions, NPO status , Patient's Chart, lab work & pertinent test results, Unable to perform ROS - Chart review only  Airway Mallampati: I  TM Distance: >3 FB Neck ROM: Full    Dental  (+) Dental Advisory Given, Teeth Intact   Pulmonary neg shortness of breath, asthma , neg sleep apnea, neg recent URI, neg PE Covid-19 Nucleic Acid Test Results Lab Results      Component                Value               Date                      SARSCOV2NAA              NEGATIVE            05/01/2021              breath sounds clear to auscultation       Cardiovascular negative cardio ROS   Rhythm:Regular     Neuro/Psych negative neurological ROS  negative psych ROS   GI/Hepatic negative GI ROS, Neg liver ROS,   Endo/Other  negative endocrine ROS  Renal/GU negative Renal ROS     Musculoskeletal negative musculoskeletal ROS (+)   Abdominal   Peds  Hematology negative hematology ROS (+)   Anesthesia Other Findings   Reproductive/Obstetrics negative OB ROS Lab Results      Component                Value               Date                      PREGTESTUR               Negative            06/20/2018                HCG                      <5.0                05/01/2021                HCGQUANT                 3,046               08/21/2019                                      Anesthesia Physical Anesthesia Plan  ASA: 2 and emergent  Anesthesia Plan: General   Post-op Pain Management:    Induction: Intravenous, Rapid sequence and Cricoid pressure planned  PONV Risk Score and Plan: 4 or greater and Ondansetron, Dexamethasone, Midazolam, Treatment may vary due to age or medical condition, Scopolamine patch - Pre-op and Diphenhydramine  Airway Management Planned: Oral ETT  Additional  Equipment: None  Intra-op Plan:   Post-operative Plan: Extubation in OR  Informed Consent: I have reviewed the patients History and Physical, chart, labs and discussed the procedure including the risks, benefits and alternatives for the proposed anesthesia  with the patient or authorized representative who has indicated his/her understanding and acceptance.     Dental advisory given  Plan Discussed with: CRNA and Anesthesiologist  Anesthesia Plan Comments:        Anesthesia Quick Evaluation

## 2021-05-01 NOTE — ED Provider Notes (Signed)
[facility]-EMERGENCY DEPT Provider Note   CSN: [id number] Arrival date & time: 05/01/21  0946     History Chief Complaint  Patient presents with   Abdominal Pain   Nausea    Kaitlyn Espinoza is a 30 y.o. female.  The history is provided by the patient.  Abdominal Pain Pain location:  RLQ Pain quality: cramping and sharp   Pain radiates to:  Does not radiate Pain severity:  Severe Onset quality:  Gradual Duration:  2 days Timing:  Constant Progression:  Worsening Chronicity:  New Context: not recent illness, not sick contacts, not suspicious food intake and not trauma   Relieved by:  Nothing Exacerbated by: walking, standing up straight. Ineffective treatments:  OTC medications Associated symptoms: anorexia, diarrhea and nausea   Associated symptoms: no chest pain, no chills, no cough, no dysuria, no fever, no hematuria, no shortness of breath, no sore throat, no vaginal bleeding, no vaginal discharge and no vomiting   Risk factors: has not had multiple surgeries       Past Medical History:  Diagnosis Date   Asthma    Ovarian cyst     Patient Active Problem List   Diagnosis Date Noted   Encounter for initial prescription of contraceptive pills 03/11/2018   Lower abdominal pain 03/11/2018   Dysmenorrhea 10/11/2015   Infertility, female 08/16/2015   PCOS (polycystic ovarian syndrome) 07/19/2015   Menorrhagia 04/08/2015   Exercise-induced asthma 04/01/2012    Past Surgical History:  Procedure Laterality Date   NO PAST SURGERIES       OB History     Gravida  2   Para  1   Term  1   Preterm      AB      Living  1      SAB      IAB      Ectopic      Multiple      Live Births  1           Family History  Problem Relation Age of Onset   Alcohol abuse Father    Hyperlipidemia Father    Diabetes Paternal Grandmother     Social History   Tobacco Use   Smoking status: Never   Smokeless tobacco: Never  Vaping Use    Vaping Use: Never used  Substance Use Topics   Alcohol use: No   Drug use: No    Home Medications Prior to Admission medications   Medication Sig Start Date End Date Taking? Authorizing Provider  acetaminophen (TYLENOL) 500 MG tablet Take 1,000 mg by mouth every 6 (six) hours as needed for mild pain.   Yes [provider]  calcium carbonate (TUMS EX) 750 MG chewable tablet Chew 2 tablets by mouth daily as needed for heartburn.   Yes [provider]  ibuprofen (ADVIL) 200 MG tablet Take 400 mg by mouth every 6 (six) hours as needed for mild pain.   Yes [provider]  Prenatal w/o A Vit-Fe Fum-FA (AZESCHEW PRENATAL/POSTNATAL PO) Take by mouth.   Yes [provider]  albuterol (PROVENTIL HFA) 108 (90 Base) MCG/ACT inhaler Inhale 2 puffs into the lungs every 6 (six) hours as needed for wheezing. 03/11/18 03/11/19  [place], Betty G, MD    Allergies    Patient has no known allergies.  Review of Systems   Review of Systems  Constitutional:  Negative for chills and fever.  HENT:  Negative for ear pain and sore  throat.   Eyes:  Negative for pain and visual disturbance.  Respiratory:  Negative for cough and shortness of breath.   Cardiovascular:  Negative for chest pain and palpitations.  Gastrointestinal:  Positive for abdominal pain, anorexia, diarrhea and nausea. Negative for vomiting.  Genitourinary:  Negative for dysuria, hematuria, vaginal bleeding and vaginal discharge.  Musculoskeletal:  Negative for arthralgias and back pain.  Skin:  Negative for color change and rash.  Neurological:  Negative for seizures and syncope.  All other systems reviewed and are negative.  Physical Exam Updated Vital Signs BP 112/82 (BP Location: Right Arm)   Pulse 91   Temp 98.1 F (36.7 C) (Oral)   Resp 16   Ht 5\' 5"  (1.651 m)   Wt 72.6 kg   SpO2 100%   BMI 26.63 kg/m   Physical Exam Vitals and nursing note reviewed.  HENT:     Head: Normocephalic and  atraumatic.  Eyes:     General: No scleral icterus. Pulmonary:     Effort: Pulmonary effort is normal. No respiratory distress.  Abdominal:     Palpations: Abdomen is soft.     Tenderness: There is abdominal tenderness in the right lower quadrant. There is guarding. Positive signs include McBurney's sign.  Musculoskeletal:     Cervical back: Normal range of motion.  Skin:    General: Skin is warm and dry.  Neurological:     General: No focal deficit present.     Mental Status: She is alert and oriented to person, place, and time.  Psychiatric:        Mood and Affect: Mood normal.    ED Results / Procedures / Treatments   Labs (all labs ordered are listed, but only abnormal results are displayed) Labs Reviewed  COMPREHENSIVE METABOLIC PANEL - Abnormal; Notable for the following components:      Result Value   AST 13 (*)    All other components within normal limits  CBC - Abnormal; Notable for the following components:   WBC 10.8 (*)    Platelets 128 (*)    All other components within normal limits  URINALYSIS, ROUTINE W REFLEX MICROSCOPIC - Abnormal; Notable for the following components:   Ketones, ur 5 (*)    All other components within normal limits  RESP PANEL BY RT-PCR (FLU A&B, COVID) ARPGX2  LIPASE, BLOOD  I-STAT BETA HCG BLOOD, ED ([facility], [facility], [facility] ONLY)    EKG None  Radiology No results found.  Procedures Procedures   Medications Ordered in ED Medications  sodium chloride 0.9 % bolus 1,000 mL (has no administration in time range)  HYDROmorphone (DILAUDID) injection 1 mg (has no administration in time range)  ondansetron (ZOFRAN) injection 4 mg (has no administration in time range)    ED Course  I have reviewed the triage vital signs and the nursing notes.  Pertinent labs & imaging results that were available during my care of the patient were reviewed by me and considered in my medical decision making (see chart for details).  Clinical Course as of  05/01/21 1339  Mon May 01, 2021  1338 I spoke with general surgery, and they will see and admit the patient for operative intervention.  [AW]    Clinical Course User Index [AW] 1339, MD   MDM Rules/Calculators/A&P                           Kaitlyn Espinoza is with right  lower quadrant pain suspicious for possible appendicitis.  She was found to have uncomplicated appendicitis and will be admitted for operative intervention. Final Clinical Impression(s) / ED Diagnoses Final diagnoses:  Acute appendicitis with localized peritonitis, without perforation, abscess, or gangrene    Rx / DC Orders ED Discharge Orders     None        Koleen Distance, MD 05/01/21 1339

## 2021-05-01 NOTE — Anesthesia Procedure Notes (Signed)
Procedure Name: Intubation Date/Time: 05/01/2021 2:46 PM Performed by: Gean Maidens, CRNA Pre-anesthesia Checklist: Emergency Drugs available, Patient identified, Suction available, Patient being monitored and Timeout performed Patient Re-evaluated:Patient Re-evaluated prior to induction Oxygen Delivery Method: Circle system utilized Preoxygenation: Pre-oxygenation with 100% oxygen Induction Type: IV induction Ventilation: Mask ventilation without difficulty Laryngoscope Size: Mac and 3 Grade View: Grade I Tube type: Oral Tube size: 7.0 mm Number of attempts: 1 Airway Equipment and Method: Stylet Placement Confirmation: ETT inserted through vocal cords under direct vision, positive ETCO2 and breath sounds checked- equal and bilateral Secured at: 21 cm Tube secured with: Tape Dental Injury: Teeth and Oropharynx as per pre-operative assessment

## 2021-05-01 NOTE — Discharge Instructions (Signed)
[facility] LAPAROSCOPIC SURGERY: POST OP INSTRUCTIONS Always review your discharge instruction sheet given to you by the facility where your surgery was performed. IF YOU HAVE DISABILITY OR FAMILY LEAVE FORMS, YOU MUST BRING THEM TO THE OFFICE FOR PROCESSING.   DO NOT GIVE THEM TO YOUR DOCTOR.  PAIN CONTROL  First take acetaminophen (Tylenol) AND/or ibuprofen (Advil) to control your pain after surgery.  Follow directions on package.  Taking acetaminophen (Tylenol) and/or ibuprofen (Advil) regularly after surgery will help to control your pain and lower the amount of prescription pain medication you may need.  You should not take more than 3,000 mg (3 grams) of acetaminophen (Tylenol) in 24 hours.  You should not take ibuprofen (Advil), aleve, motrin, naprosyn or other NSAIDS if you have a history of stomach ulcers or chronic kidney disease.  A prescription for pain medication may be given to you upon discharge.  Take your pain medication as prescribed, if you still have uncontrolled pain after taking acetaminophen (Tylenol) or ibuprofen (Advil). Use ice packs to help control pain. If you need a refill on your pain medication, please contact your pharmacy.  They will contact our office to request authorization. Prescriptions will not be filled after 5pm or on week-ends.  HOME MEDICATIONS Take your usually prescribed medications unless otherwise directed.  DIET You should follow a light diet the first few days after arrival home.  Be sure to include lots of fluids daily. Avoid fatty, fried foods.   CONSTIPATION It is common to experience some constipation after surgery and if you are taking pain medication.  Increasing fluid intake and taking a stool softener (such as Colace) will usually help or prevent this problem from occurring.  A mild laxative (Milk of Magnesia or Miralax) should be taken according to package instructions if there are no bowel movements after 48  hours.  WOUND/INCISION CARE Most patients will experience some swelling and bruising in the area of the incisions.  Ice packs will help.  Swelling and bruising can take several days to resolve.  Unless discharge instructions indicate otherwise, follow guidelines below  STERI-STRIPS - you may remove your outer bandages 48 hours after surgery, and you may shower at that time.  You have steri-strips (small skin tapes) in place directly over the incision.  These strips should be left on the skin for 7-10 days.   DERMABOND/SKIN GLUE - you may shower in 24 hours.  The glue will flake off over the next 2-3 weeks. Any sutures or staples will be removed at the office during your follow-up visit.  ACTIVITIES You may resume regular (light) daily activities beginning the next day--such as daily self-care, walking, climbing stairs--gradually increasing activities as tolerated.  You may have sexual intercourse when it is comfortable.  Refrain from any heavy lifting or straining until approved by your doctor. You may drive when you are no longer taking prescription pain medication, you can comfortably wear a seatbelt, and you can safely maneuver your car and apply brakes.  FOLLOW-UP You should see your doctor in the office for a follow-up appointment approximately 2-3 weeks after your surgery.  You should have been given your post-op/follow-up appointment when your surgery was scheduled.  If you did not receive a post-op/follow-up appointment, make sure that you call for this appointment within a day or two after you arrive home to insure a convenient appointment time.   WHEN TO CALL YOUR DOCTOR: Fever over 101.0 Inability to urinate Continued bleeding from incision.   Increased pain, redness, or drainage from the incision. Increasing abdominal pain  The clinic staff is available to answer your questions during regular business hours.  Please don't hesitate to call and ask to speak to one of the nurses for  clinical concerns.  If you have a medical emergency, go to the nearest emergency room or call 911.  A surgeon from [facility] is always on call at the hospital. [address], [address], [place], [place]  [address] ? [address] [address], [place], [place]   [address] [phone] ? [phone] ? FAX [phone] Web site: [email or web address]      Managing Your Pain After Surgery Without Opioids    Thank you for participating in our program to help patients manage their pain after surgery without opioids. This is part of our effort to provide you with the best care possible, without exposing you or your family to the risk that opioids pose.  What pain can I expect after surgery? You can expect to have some pain after surgery. This is normal. The pain is typically worse the day after surgery, and quickly begins to get better. Many studies have found that many patients are able to manage their pain after surgery with Over-the-Counter (OTC) medications such as Tylenol and Motrin. If you have a condition that does not allow you to take Tylenol or Motrin, notify your surgical team.  How will I manage my pain? The best strategy for controlling your pain after surgery is around the clock pain control with Tylenol (acetaminophen) and Motrin (ibuprofen or Advil). Alternating these medications with each other allows you to maximize your pain control. In addition to Tylenol and Motrin, you can use heating pads or ice packs on your incisions to help reduce your pain.  How will I alternate your regular strength over-the-counter pain medication? You will take a dose of pain medication every three hours. Start by taking 650 mg of Tylenol (2 pills of 325 mg) 3 hours later take 600 mg of Motrin (3 pills of 200 mg) 3 hours after taking the Motrin take 650 mg of Tylenol 3 hours after that take 600 mg of Motrin.   - 1 -  See example - if your first dose of Tylenol is at 12:00  PM   12:00 PM Tylenol 650 mg (2 pills of 325 mg)  3:00 PM Motrin 600 mg (3 pills of 200 mg)  6:00 PM Tylenol 650 mg (2 pills of 325 mg)  9:00 PM Motrin 600 mg (3 pills of 200 mg)  Continue alternating every 3 hours   We recommend that you follow this schedule around-the-clock for at least 3 days after surgery, or until you feel that it is no longer needed. Use the table on the last page of this handout to keep track of the medications you are taking. Important: Do not take more than 3000mg of Tylenol or 3200mg of Motrin in a 24-hour period. Do not take ibuprofen/Motrin if you have a history of bleeding stomach ulcers, severe kidney disease, &/or actively taking a blood thinner  What if I still have pain? If you have pain that is not controlled with the over-the-counter pain medications (Tylenol and Motrin or Advil) you might have what we call "breakthrough" pain. You will receive a prescription for a small amount of an opioid pain medication such as Oxycodone, Tramadol, or Tylenol with Codeine. Use these opioid pills in the first 24 hours after surgery if you have breakthrough pain. Do   not take more than 1 pill every 4-6 hours.  If you still have uncontrolled pain after using all opioid pills, don't hesitate to call our staff using the number provided. We will help make sure you are managing your pain in the best way possible, and if necessary, we can provide a prescription for additional pain medication.   Day 1    Time  Name of Medication Number of pills taken  Amount of Acetaminophen  Pain Level   Comments  AM PM       AM PM       AM PM       AM PM       AM PM       AM PM       AM PM       AM PM       Total Daily amount of Acetaminophen Do not take more than  3,000 mg per day      Day 2    Time  Name of Medication Number of pills taken  Amount of Acetaminophen  Pain Level   Comments  AM PM       AM PM       AM PM       AM PM       AM PM       AM PM       AM  PM       AM PM       Total Daily amount of Acetaminophen Do not take more than  3,000 mg per day      Day 3    Time  Name of Medication Number of pills taken  Amount of Acetaminophen  Pain Level   Comments  AM PM       AM PM       AM PM       AM PM          AM PM       AM PM       AM PM       AM PM       Total Daily amount of Acetaminophen Do not take more than  3,000 mg per day      Day 4    Time  Name of Medication Number of pills taken  Amount of Acetaminophen  Pain Level   Comments  AM PM       AM PM       AM PM       AM PM       AM PM       AM PM       AM PM       AM PM       Total Daily amount of Acetaminophen Do not take more than  3,000 mg per day      Day 5    Time  Name of Medication Number of pills taken  Amount of Acetaminophen  Pain Level   Comments  AM PM       AM PM       AM PM       AM PM       AM PM       AM PM       AM PM       AM PM       Total Daily amount of Acetaminophen Do not take more than    3,000 mg per day       Day 6    Time  Name of Medication Number of pills taken  Amount of Acetaminophen  Pain Level  Comments  AM PM       AM PM       AM PM       AM PM       AM PM       AM PM       AM PM       AM PM       Total Daily amount of Acetaminophen Do not take more than  3,000 mg per day      Day 7    Time  Name of Medication Number of pills taken  Amount of Acetaminophen  Pain Level   Comments  AM PM       AM PM       AM PM       AM PM       AM PM       AM PM       AM PM       AM PM       Total Daily amount of Acetaminophen Do not take more than  3,000 mg per day        For additional information about how and where to safely dispose of unused opioid medications - https://www.morepowerfulnc.org  Disclaimer: This document contains information and/or instructional materials adapted from [facility] for the typical patient with your condition. It does not replace medical advice  from your health care provider because your experience may differ from that of the typical patient. Talk to your health care provider if you have any questions about this document, your condition or your treatment plan. Adapted from [facility]  

## 2021-05-01 NOTE — ED Triage Notes (Addendum)
Patient c/o RLQ abdominal pain, nausea, and one episode of diarrhea since yesterday. Patient states pain is worse when ambulating or touching it.

## 2021-05-02 NOTE — Anesthesia Postprocedure Evaluation (Signed)
Anesthesia Post Note  Patient: Kaitlyn Espinoza  Procedure(s) Performed: APPENDECTOMY LAPAROSCOPIC (Abdomen)     Patient location during evaluation: PACU Anesthesia Type: General Level of consciousness: awake and alert Pain management: pain level controlled Vital Signs Assessment: post-procedure vital signs reviewed and stable Respiratory status: spontaneous breathing, nonlabored ventilation, respiratory function stable and patient connected to nasal cannula oxygen Cardiovascular status: blood pressure returned to baseline and stable Postop Assessment: no apparent nausea or vomiting Anesthetic complications: no   No notable events documented.  Last Vitals:  Vitals:   05/01/21 1620 05/01/21 1801  BP: 112/68 110/70  Pulse: (!) 105 (!) 102  Resp: 16 14  Temp: 36.8 C   SpO2: 97% 97%    Last Pain:  Vitals:   05/01/21 1845  TempSrc:   PainSc: 4                  Cortez Steelman

## 2021-05-03 ENCOUNTER — Encounter (HOSPITAL_COMMUNITY): Payer: Self-pay | Admitting: General Surgery

## 2021-05-03 LAB — SURGICAL PATHOLOGY

## 2021-05-12 IMAGING — US US OB < 14 WEEKS - US OB TV
1 series · 15 of 28 positions shown · non-contrast
Comparison: Pelvic ultrasound 12/23/2015

CLINICAL DATA: Abdominal pain and first-trimester pregnancy

EXAM:
OBSTETRIC <14 WK US AND TRANSVAGINAL OB US
TECHNIQUE: Both transabdominal and transvaginal ultrasound examinations were
performed for complete evaluation of the gestation as well as the
maternal uterus, adnexal regions, and pelvic cul-de-sac.
Transvaginal technique was performed to assess early pregnancy.

[Series 1: us ob < 14 weeks - us ob tv · 15 of 66 slices shown]
[im 1/66]
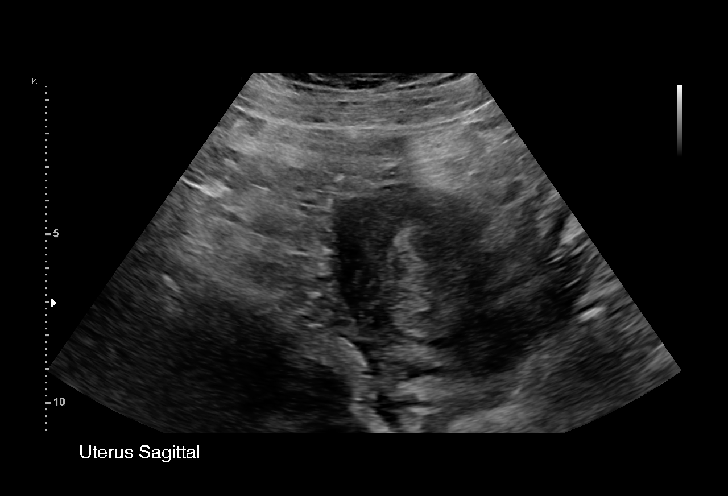
[im 5/66]
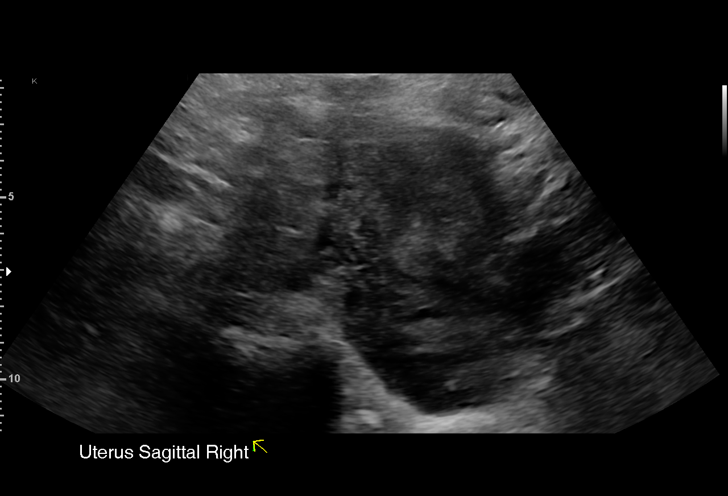
[im 10/66]
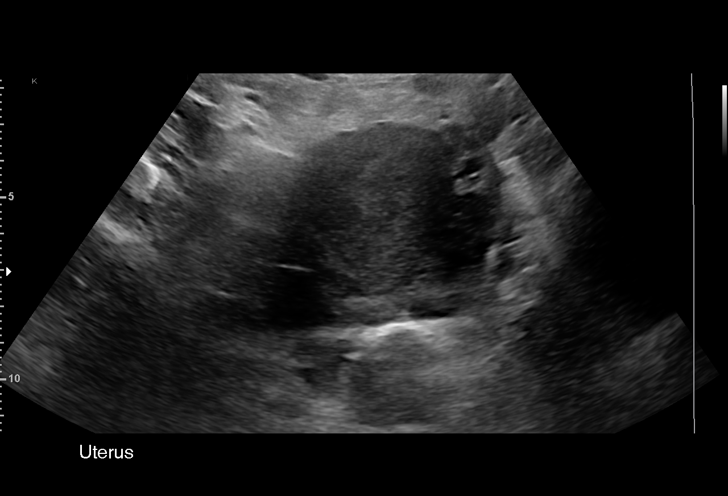
[im 15/66]
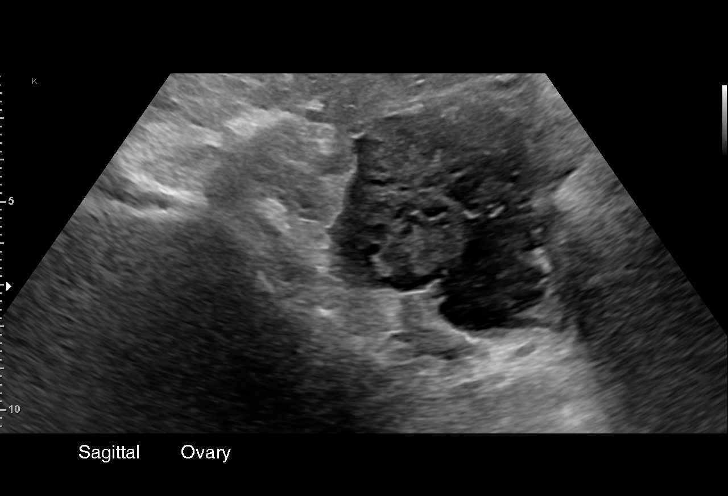
[im 20/66]
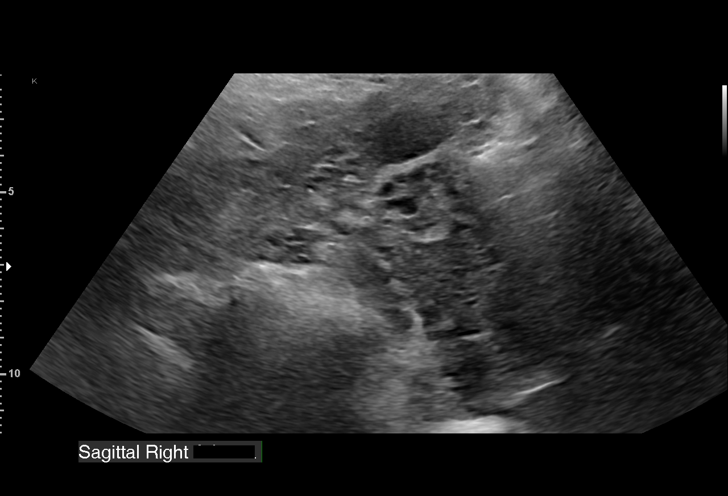
[im 25/66]
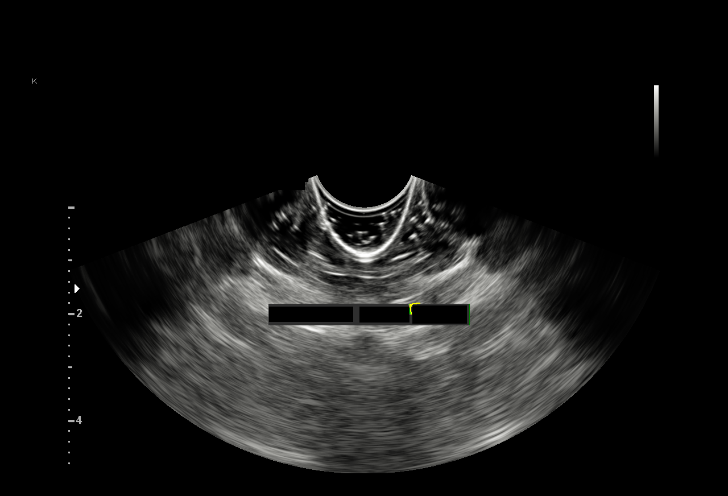
[im 29/66]
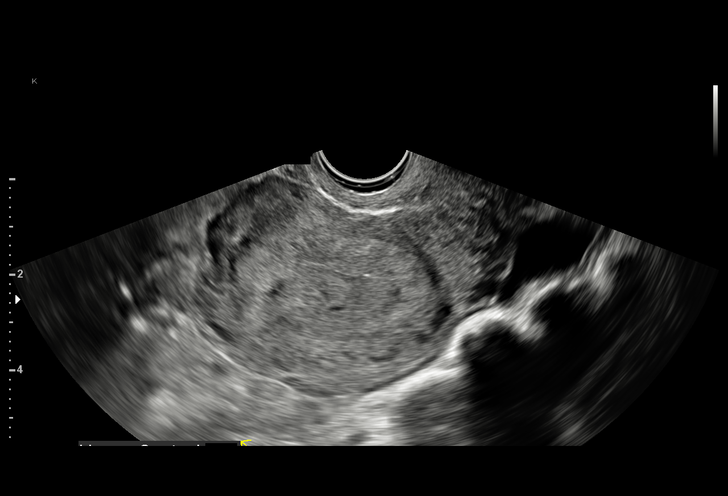
[im 34/66]
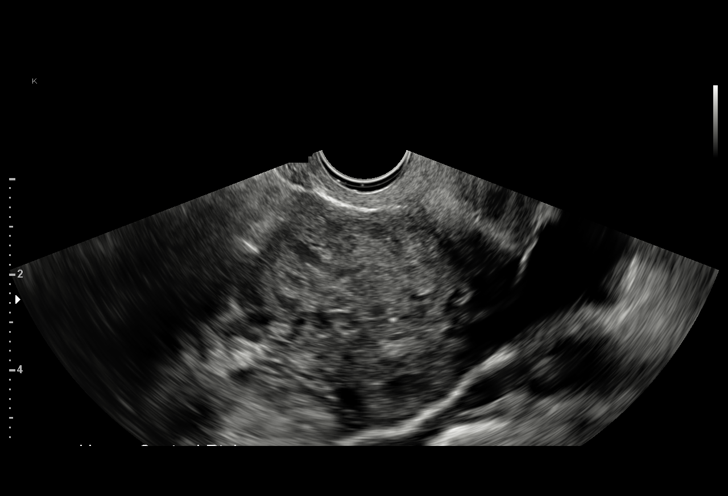
[im 37/66]
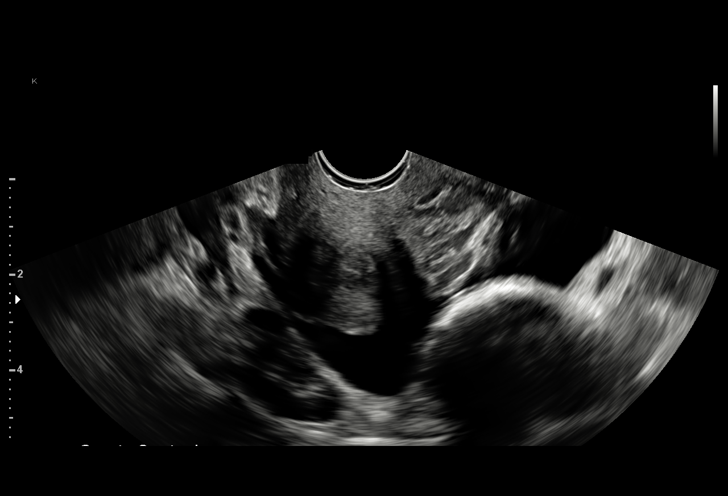
[im 41/66]
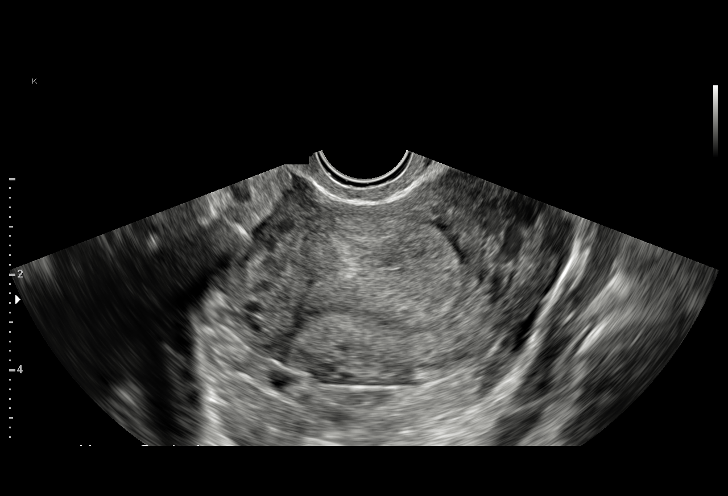
[im 46/66]
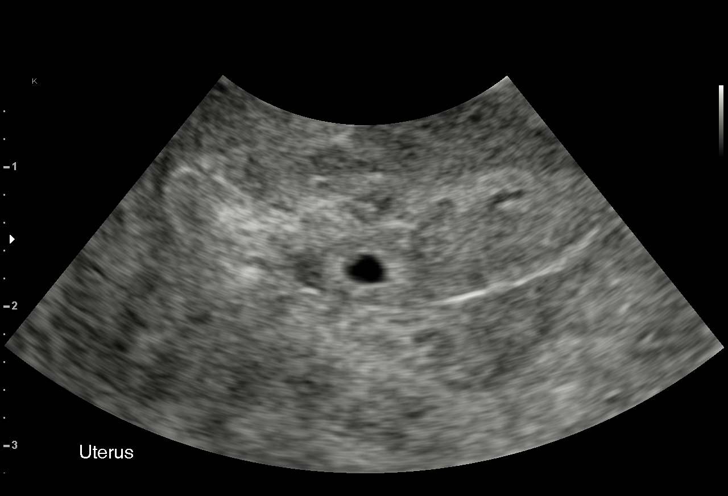
[im 51/66]
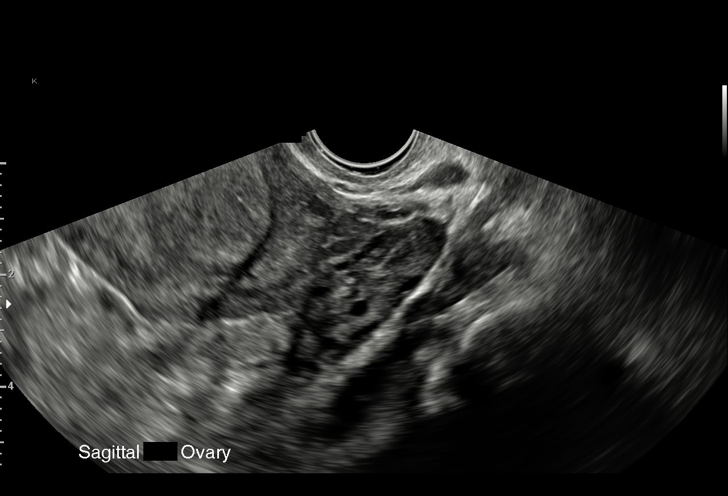
[im 56/66]
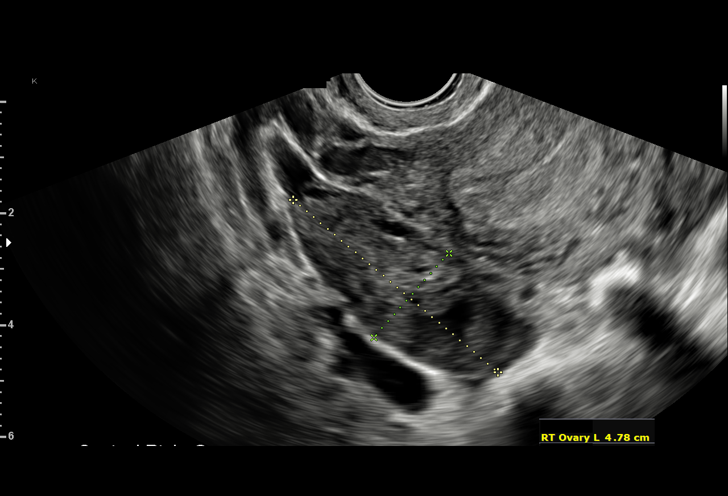
[im 61/66]
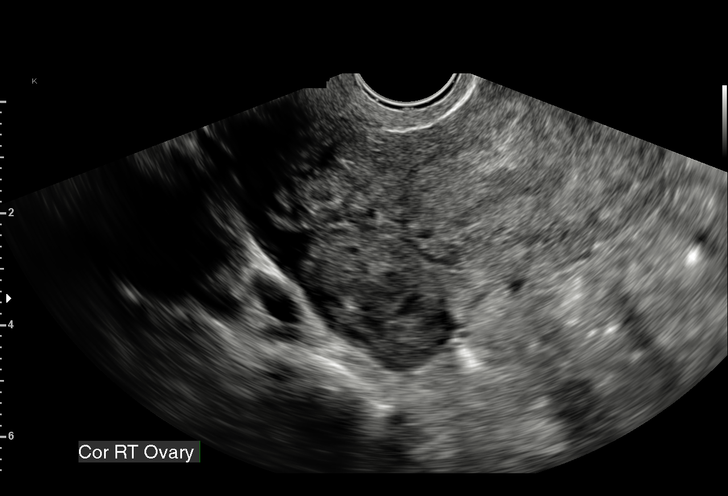
[im 66/66]
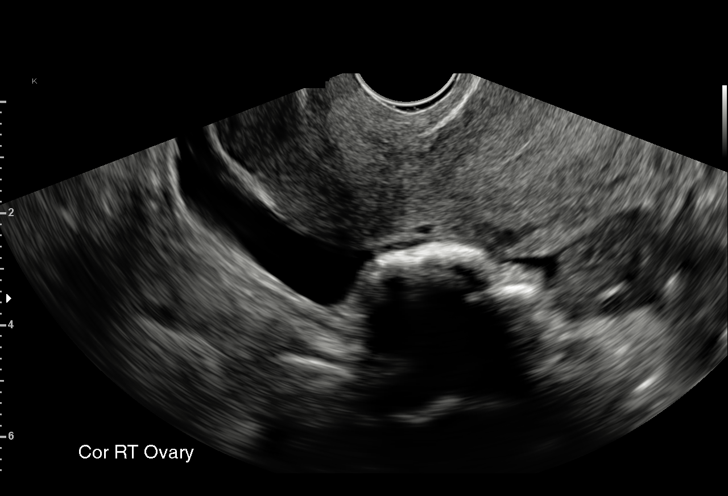

[15 of 28 positions shown; findings below may reference images not displayed]

FINDINGS: There is an intra decidual cystic structure with a mean sac diameter
of 2.8 mm. No internal fetal pole or yolk sac is visible.

Peripherally hypervascular cystic structure with thick wall at the
right adnexa, intra-ovarian based on claw sign. This is most
consistent with corpus luteum. No left adnexal mass.

Pelvic fluid which is simple in nature and overall small volume
although seen bilateral and nearly moderate. No adnexal mass.
IMPRESSION: 1. Pregnancy of unknown location. There is a small intra decidual
cystic structure measuring 2.8 mm mean sac diameter, but no
identifiable yolk sac or fetal pole. Consider follow-up ultrasound
in 10 days and serial quantitative beta HCG follow-up.
2. Right adnexal cystic structure is intra-ovarian and consistent
with corpus luteum. No extra ovarian adnexal mass.
3. Pelvic fluid that is simple.

## 2022-01-15 DIAGNOSIS — H5213 Myopia, bilateral: Secondary | ICD-10-CM | POA: Diagnosis not present

## 2022-11-28 ENCOUNTER — Ambulatory Visit: Payer: No Typology Code available for payment source | Admitting: Family

## 2022-12-12 DIAGNOSIS — N898 Other specified noninflammatory disorders of vagina: Secondary | ICD-10-CM | POA: Diagnosis not present

## 2022-12-12 DIAGNOSIS — R300 Dysuria: Secondary | ICD-10-CM | POA: Diagnosis not present

## 2022-12-20 ENCOUNTER — Emergency Department (HOSPITAL_COMMUNITY): Payer: No Typology Code available for payment source

## 2022-12-20 ENCOUNTER — Other Ambulatory Visit: Payer: Self-pay

## 2022-12-20 ENCOUNTER — Emergency Department (HOSPITAL_COMMUNITY)
Admission: EM | Admit: 2022-12-20 | Discharge: 2022-12-20 | Disposition: A | Discharge status: Home / Self Care | Payer: No Typology Code available for payment source | Attending: Emergency Medicine | Admitting: Emergency Medicine

## 2022-12-20 DIAGNOSIS — R0982 Postnasal drip: Secondary | ICD-10-CM

## 2022-12-20 DIAGNOSIS — R059 Cough, unspecified: Secondary | ICD-10-CM | POA: Insufficient documentation

## 2022-12-20 DIAGNOSIS — J029 Acute pharyngitis, unspecified: Secondary | ICD-10-CM | POA: Diagnosis present

## 2022-12-20 LAB — GROUP A STREP BY PCR: Group A Strep by PCR: NOT DETECTED

## 2022-12-20 MED ORDER — FLUTICASONE PROPIONATE 50 MCG/ACT NA SUSP: 2.0000 | Freq: Every day | NASAL | 2 refills | Status: AC

## 2022-12-20 MED ORDER — DEXAMETHASONE SODIUM PHOSPHATE 10 MG/ML IJ SOLN: 10.0000 mg | Freq: Once | INTRAMUSCULAR | Status: DC

## 2022-12-20 MED ORDER — DEXAMETHASONE SODIUM PHOSPHATE 10 MG/ML IJ SOLN
10.0000 mg | Freq: Once | INTRAMUSCULAR | Status: AC
  Administered 2022-12-20: 10 mg via INTRAMUSCULAR
  Filled 2022-12-20: qty 1

## 2022-12-20 NOTE — ED Provider Notes (Signed)
[facility] Provider Note   CSN: [id number] Arrival date & time: 12/20/22  0129     History  Chief Complaint  Patient presents with   Sore Throat    Kaitlyn Espinoza is a 32 y.o. female.   Sore Throat     Patient denies any emergency department due to sore throat.  Symptoms started 3 days ago around the time start taking Macrobid for a UTI.  Feels as being stuck in her throat, do not stop while eating food.  No history of endoscopy or esophageal dilation, she is having a nonproductive cough but denies any chest pain or shortness of breath.  Is worse when she tries to swallow, no change in phonation.  Does have a history of postnasal drip and is having nasal drainage.  Home Medications Prior to Admission medications   Medication Sig Start Date End Date Taking? Authorizing Provider  fluticasone (FLONASE) 50 MCG/ACT nasal spray Place 2 sprays into both nostrils daily. 12/20/22  Yes Sherrill Raring, PA-C  acetaminophen (TYLENOL) 500 MG tablet Take 1,000 mg by mouth every 6 (six) hours as needed for mild pain.    [provider]  albuterol (PROVENTIL HFA) 108 (90 Base) MCG/ACT inhaler Inhale 2 puffs into the lungs every 6 (six) hours as needed for wheezing. 03/11/18 03/11/19  Martinique, Betty G, MD  calcium carbonate (TUMS EX) 750 MG chewable tablet Chew 2 tablets by mouth daily as needed for heartburn.    [provider]  ibuprofen (ADVIL) 200 MG tablet Take 400 mg by mouth every 6 (six) hours as needed for mild pain.    [provider]  ondansetron (ZOFRAN) 4 MG tablet Take 1 tablet (4 mg total) by mouth every 8 (eight) hours as needed for nausea or vomiting. 05/01/21   Greer Pickerel, MD  oxyCODONE (OXY IR/ROXICODONE) 5 MG immediate release tablet Take 1 tablet (5 mg total) by mouth every 4 (four) hours as needed for severe pain or moderate pain. 05/01/21   Norm Parcel, PA-C  Prenatal w/o A Vit-Fe Fum-FA (AZESCHEW  PRENATAL/POSTNATAL PO) Take by mouth.    [provider]      Allergies    Patient has no known allergies.    Review of Systems   Review of Systems  Physical Exam Updated Vital Signs BP 130/82 (BP Location: Left Arm)   Pulse 87   Temp 98.3 F (36.8 C) (Oral)   Resp 15   SpO2 100%  Physical Exam Vitals and nursing note reviewed. Exam conducted with a chaperone present.  Constitutional:      General: She is not in acute distress.    Appearance: Normal appearance. She is well-developed.  HENT:     Head: Normocephalic and atraumatic.     Mouth/Throat:     Comments: Cobblestoning to the oropharynx, normal phonation uvula is midline Eyes:     General: No scleral icterus.       Right eye: No discharge.        Left eye: No discharge.     Extraocular Movements: Extraocular movements intact.     Conjunctiva/sclera: Conjunctivae normal.     Pupils: Pupils are equal, round, and reactive to light.  Cardiovascular:     Rate and Rhythm: Normal rate and regular rhythm.     Pulses: Normal pulses.     Heart sounds: Normal heart sounds. No murmur heard.    No friction rub. No gallop.  Pulmonary:  Effort: Pulmonary effort is normal. No respiratory distress.     Breath sounds: Normal breath sounds.  Abdominal:     General: Abdomen is flat. Bowel sounds are normal. There is no distension.     Palpations: Abdomen is soft.     Tenderness: There is no abdominal tenderness.  Musculoskeletal:        General: No swelling.     Cervical back: Neck supple.  Skin:    General: Skin is warm and dry.     Capillary Refill: Capillary refill takes less than 2 seconds.     Coloration: Skin is not jaundiced.  Neurological:     Mental Status: She is alert. Mental status is at baseline.     Coordination: Coordination normal.  Psychiatric:        Mood and Affect: Mood normal.     ED Results / Procedures / Treatments   Labs (all labs ordered are listed, but only abnormal results are  displayed) Labs Reviewed  GROUP A STREP BY PCR    EKG None  Radiology DG Chest 2 View  Result Date: 12/20/2022 CLINICAL DATA:  Cough EXAM: CHEST - 2 VIEW COMPARISON:  12/05/2012 FINDINGS: Heart and mediastinal contours are within normal limits. No focal opacities or effusions. No acute bony abnormality. IMPRESSION: No active cardiopulmonary disease. Electronically Signed   By: Rolm Baptise M.D.   On: 12/20/2022 02:11    Procedures Procedures    Medications Ordered in ED Medications  dexamethasone (DECADRON) injection 10 mg (10 mg Intramuscular Given 12/20/22 0222)    ED Course/ Medical Decision Making/ A&P                             Medical Decision Making Amount and/or Complexity of Data Reviewed Radiology: ordered.  Risk Prescription drug management.   Patient presents to the emergency department due to sore throat.    Physical exam is notable for cobblestoning to the posterior oropharynx, normal phonation.  Protecting airway, tolerating p.o. solids and liquids.  Lungs are clear to auscultation, additionally history is not consistent with ingested foreign body or food bolus.  Symptoms seem related to allergies, she is not really having any viral symptoms.  No fever, no septic criteria.    On exam there is no indication of peritonsillar abscess, retropharyngeal abscess or Ludwig angina.  Chest x-ray was ordered in triage, negative for any acute process.  Strep ordered for reassurance to the patient although lower suspicion based on exam and history.  Will treat patient on Flonase and have her follow-up with her PCP if no improvement.  I think any additional workup is indicated to the ED, stable for outpatient follow-up.        Final Clinical Impression(s) / ED Diagnoses Final diagnoses:  Post-nasal drip    Rx / DC Orders ED Discharge Orders          Ordered    fluticasone (FLONASE) 50 MCG/ACT nasal spray  Daily        12/20/22 0303              Sherrill Raring, PA-C 12/20/22 0350    Fatima Blank, MD 12/20/22 [phone]

## 2022-12-20 NOTE — Discharge Instructions (Addendum)
I suspect the sore throat is likely viral versus postnasal drip from allergies.  Use Flonase twice daily for the next week, which restricted his symptoms.  See your doctor next week for reevaluation if symptoms persist.  Turn to ED for new or concerning symptoms.

## 2022-12-20 NOTE — ED Triage Notes (Signed)
Pt c/o sore throat since Sunday. States concern as she feels something lodged in her throat. Endorses dry cough. No resp distress in triage.

## 2023-01-23 IMAGING — CT CT ABD-PELV W/ CM
2 of 4 series · 16 of 46 positions shown, 18 images · IV contrast (omnipaque)
Comparison: Abdominal ultrasound, 04/15/2018.

CLINICAL DATA: 30-year-old female with right lower quadrant
abdominal pain

EXAM:
CT ABDOMEN AND PELVIS WITH CONTRAST
TECHNIQUE: Multidetector CT imaging of the abdomen and pelvis was then
performed following the standard protocol during the bolus injection
of intravenous contrast.
CONTRAST:  80mL OMNIPAQUE IOHEXOL 350 MG/ML SOLN

[Series 2: axial st · axial · 0.70mm/px · z∈[+1078,+1478]mm · 13 of 92 slices shown, 15 images]
[im 6/92  soft-tissue]
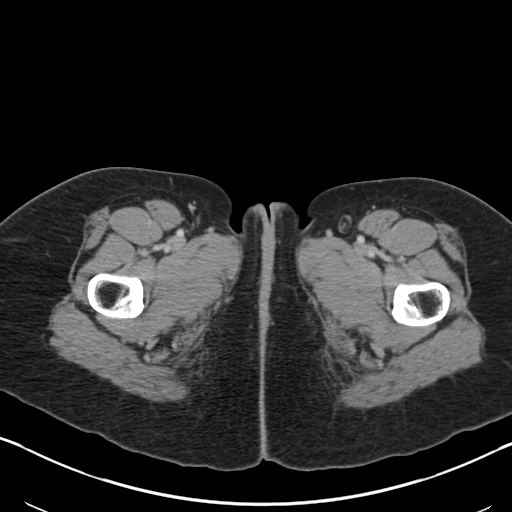
[im 6/92  bone]
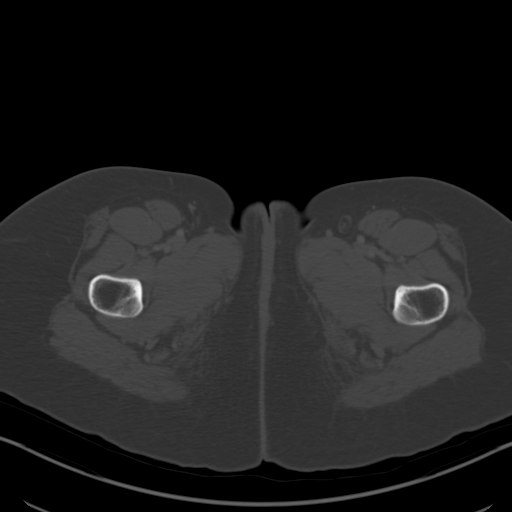
[im 11/92  soft-tissue]
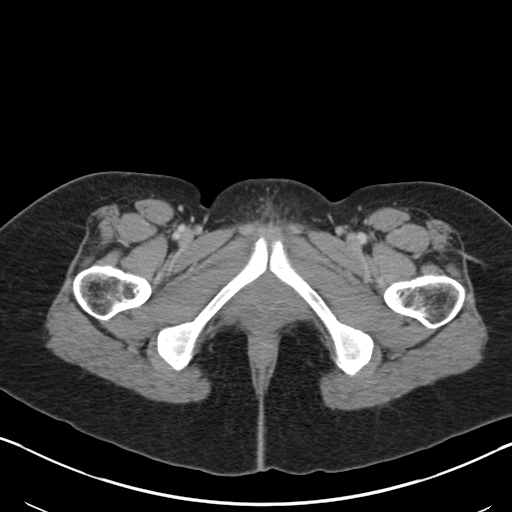
[im 21/92  soft-tissue]
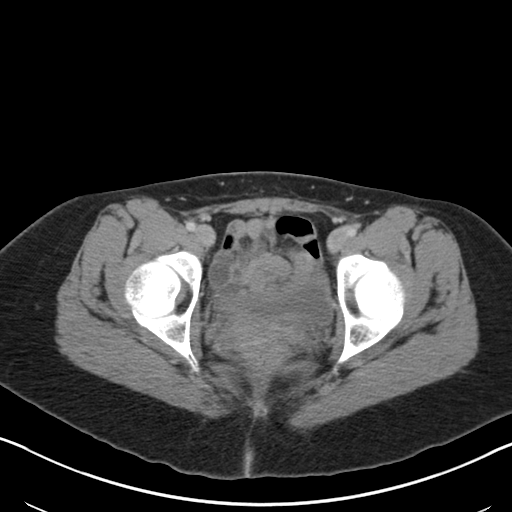
[im 26/92  soft-tissue]
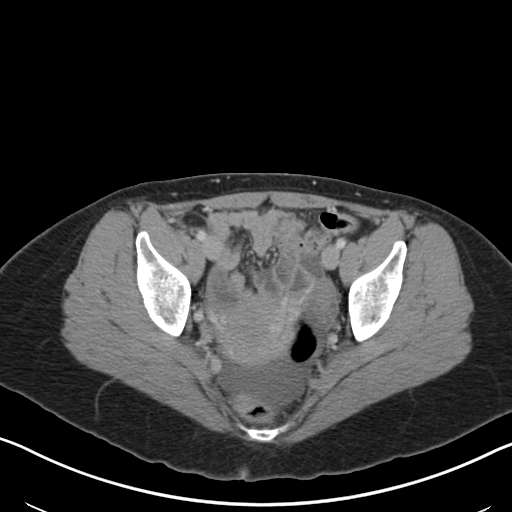
[im 31/92  soft-tissue]
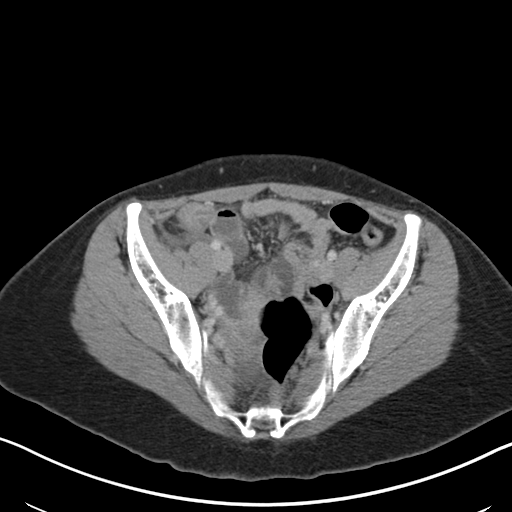
[im 41/92  soft-tissue]
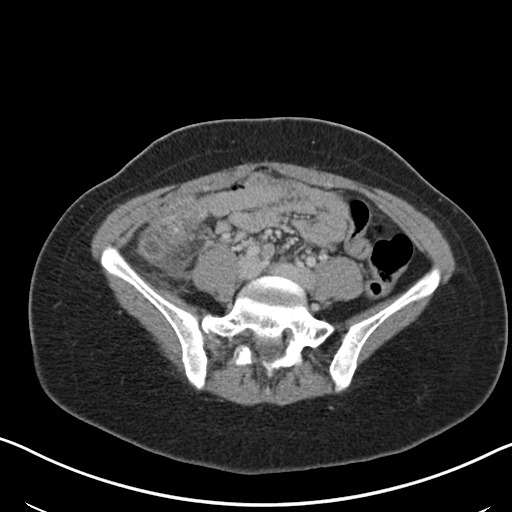
[im 46/92  soft-tissue]
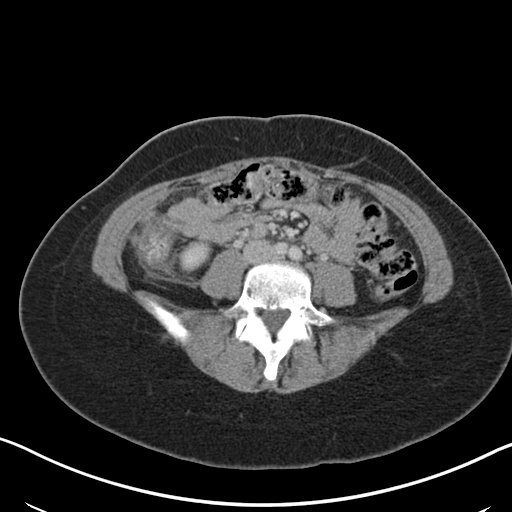
[im 51/92  soft-tissue]
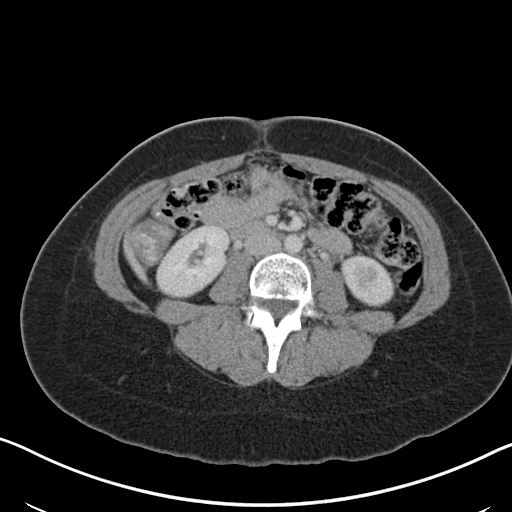
[im 61/92  soft-tissue]
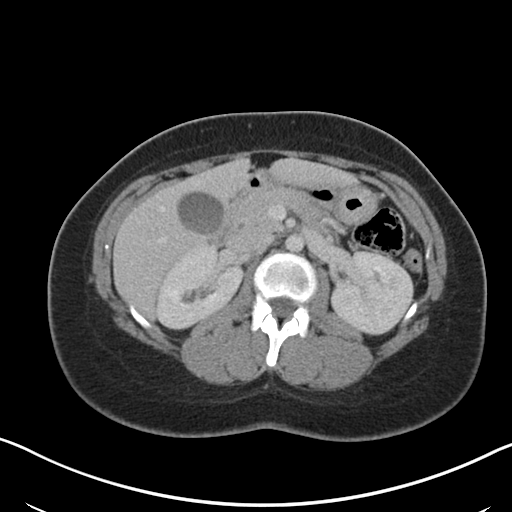
[im 61/92  bone]
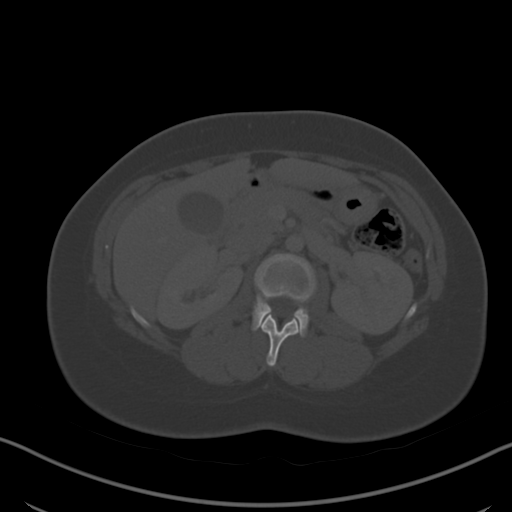
[im 66/92  soft-tissue]
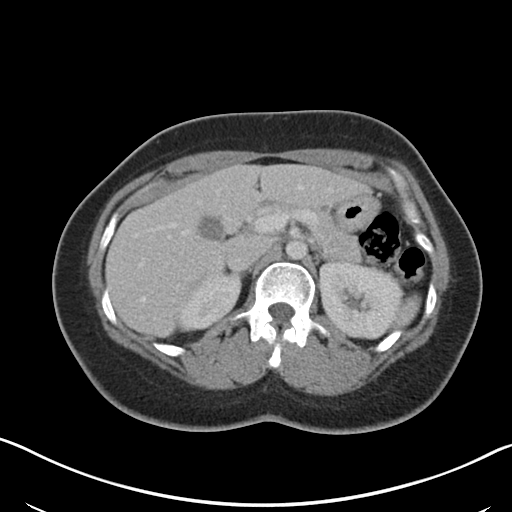
[im 71/92  soft-tissue]
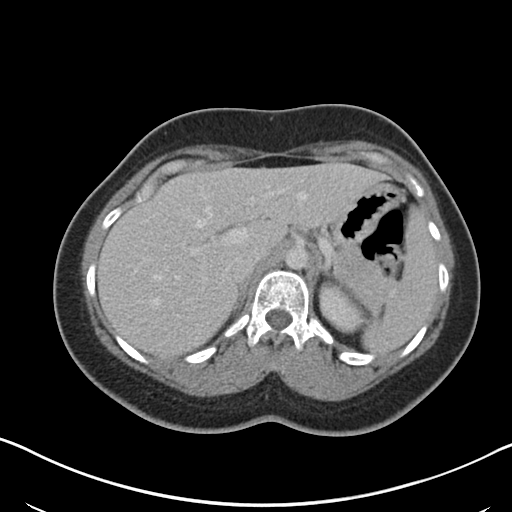
[im 81/92  soft-tissue]
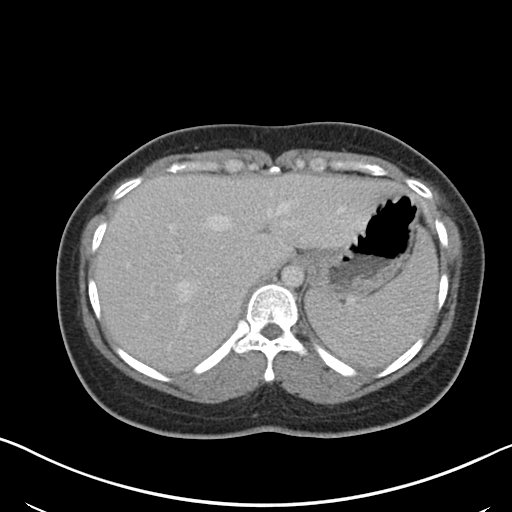
[im 86/92  soft-tissue]
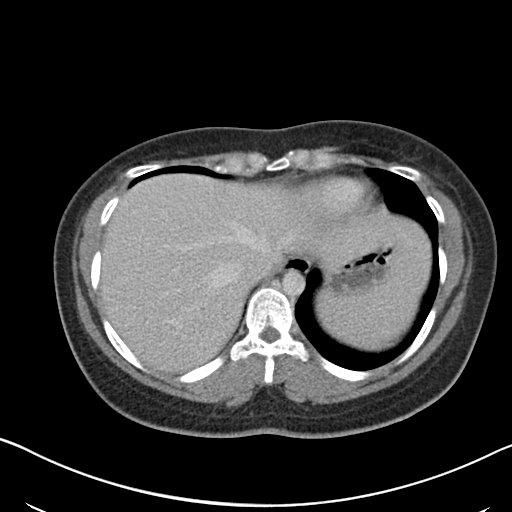

[Series 5: coronal st · coronal · 0.69mm/px · 3 of 131 slices shown]
[im 44/131  soft-tissue]
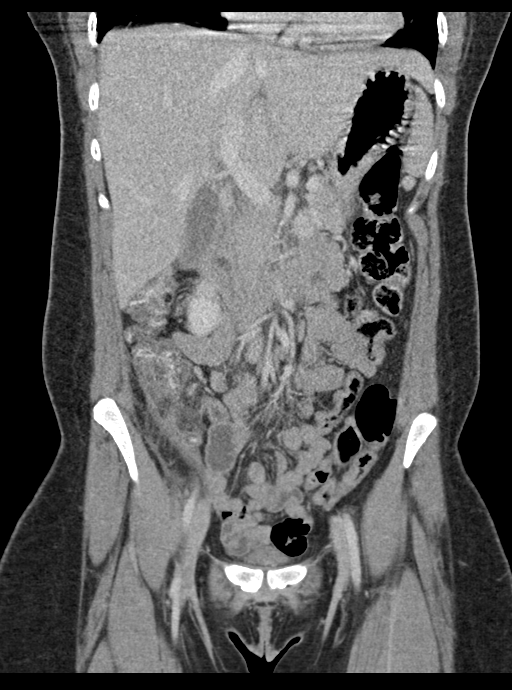
[im 58/131  soft-tissue]
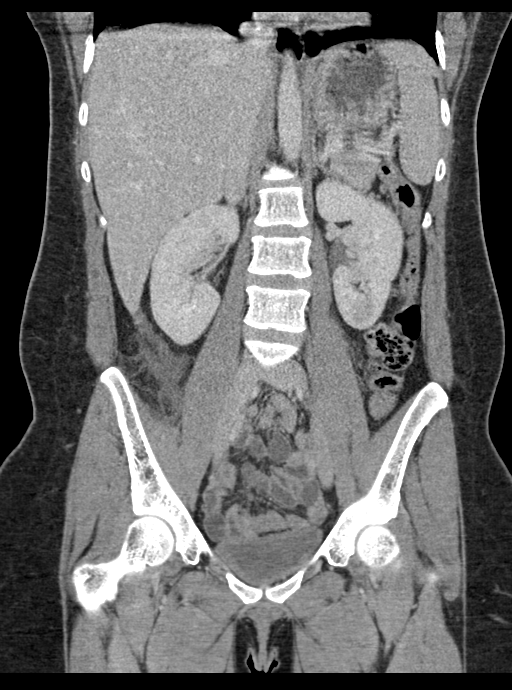
[im 73/131  soft-tissue]
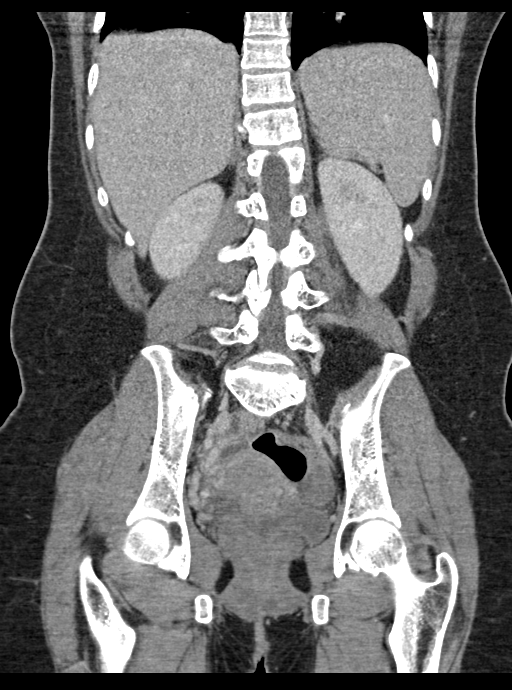

[16 of 46 positions shown; findings below may reference images not displayed]

FINDINGS: The examination is mildly degraded secondary to motion artifact.

Lower chest: No acute abnormality.

Hepatobiliary: No focal liver abnormality is seen. No gallstones,
gallbladder wall thickening, or biliary dilatation.

Pancreas: Unremarkable. No pancreatic ductal dilatation or
surrounding inflammatory changes.

Spleen: Normal in size without focal abnormality.

Adrenals/Urinary Tract: Adrenal glands are unremarkable. Small right
upper pole renal cyst. The kidneys are otherwise normal, without
renal calculi or hydronephrosis. Bladder is unremarkable.

Stomach/Bowel: Distended appendix, measuring up to 15 mm in greatest
axial dimension, with distal radiodense appendicolith.
Periappendiceal stranding. No evidence of perforation, focal
drainable collection or abscess.

Vascular/Lymphatic: No significant vascular findings are present. No
enlarged abdominal or pelvic lymph nodes.

Reproductive: Uterus and bilateral adnexa are unremarkable.

Other: Trace pelvic ascites, likely reactive

Musculoskeletal: No acute or significant osseous findings.
IMPRESSION: Acute, uncomplicated appendicitis.

Findings were called by telephone at the time of interpretation on
05/01/2021 at [DATE] to provider LUSIK BUVENS , who verbally
acknowledged these results.

## 2023-03-27 DIAGNOSIS — B370 Candidal stomatitis: Secondary | ICD-10-CM | POA: Diagnosis not present

## 2023-03-27 DIAGNOSIS — N943 Premenstrual tension syndrome: Secondary | ICD-10-CM | POA: Diagnosis not present

## 2023-03-27 DIAGNOSIS — Z30011 Encounter for initial prescription of contraceptive pills: Secondary | ICD-10-CM | POA: Diagnosis not present

## 2023-03-27 DIAGNOSIS — N939 Abnormal uterine and vaginal bleeding, unspecified: Secondary | ICD-10-CM | POA: Diagnosis not present

## 2023-03-27 DIAGNOSIS — R102 Pelvic and perineal pain: Secondary | ICD-10-CM | POA: Diagnosis not present

## 2023-03-27 DIAGNOSIS — N926 Irregular menstruation, unspecified: Secondary | ICD-10-CM | POA: Diagnosis not present

## 2023-04-16 ENCOUNTER — Encounter: Payer: Self-pay | Admitting: *Deleted

## 2023-04-17 ENCOUNTER — Ambulatory Visit: Payer: No Typology Code available for payment source | Admitting: Neurology

## 2023-04-17 ENCOUNTER — Telehealth: Payer: Self-pay | Admitting: Neurology

## 2023-04-17 ENCOUNTER — Encounter: Payer: Self-pay | Admitting: Neurology

## 2023-04-17 NOTE — Telephone Encounter (Signed)
NS for new neuro consult appt.

## 2023-07-24 ENCOUNTER — Encounter: Payer: Self-pay | Admitting: Neurology

## 2023-07-24 ENCOUNTER — Ambulatory Visit (INDEPENDENT_AMBULATORY_CARE_PROVIDER_SITE_OTHER): Payer: No Typology Code available for payment source | Admitting: Neurology

## 2023-07-24 VITALS — BP 99/63 | HR 67 | Ht 65.0 in | Wt 176.0 lb

## 2023-07-24 DIAGNOSIS — G43009 Migraine without aura, not intractable, without status migrainosus: Secondary | ICD-10-CM | POA: Diagnosis not present

## 2023-07-24 MED ORDER — RIZATRIPTAN BENZOATE 5 MG PO TBDP
5.0000 mg | ORAL_TABLET | ORAL | 3 refills | Status: AC | PRN
Start: 2023-07-24 — End: ?

## 2023-07-24 NOTE — Progress Notes (Signed)
Subjective:    Patient ID: Kaitlyn Espinoza is a 32 y.o. female.  HPI    Huston Foley, MD, PhD [facility] [address], [address] [address] [place], [place] [address]  Dear Dr. Leavy Cella,  I saw your patient, Kaitlyn Espinoza, upon your kind request in my neurologic clinic today for evaluation of her recurrent headaches.  The patient is unaccompanied today. She missed an appointment on 04/17/23. As you know, Kaitlyn Espinoza is a 32 year old female with an underlying medical history of asthma, low platelets, PCOS, abnormal uterine bleeding, low TSH, and overweight state, who reports migraine headaches for the past few months to years, migraines are infrequent, she has occasional dull and milder headaches but has had some severe migraines including visual blurriness, associated with nausea and even vomiting at times.  She has not had any associated neurological accompaniments, never had any strokelike symptoms such as one-sided weakness or numbness or tingling or droopy face or slurring of speech.  She had a recent eye exam in July and has a mild prescription for eyeglasses.  I reviewed your office note from 01/18/2023.  She has not tried any migraine medications through your office.  She reports a migraine frequency of 1 or less than 1/month currently.  She often starts with having a blurry vision and then a severe headache that is typically on the top of her head, triggers may be stress and dehydration.  1 time in the summer, in May or June she had to call EMS.  She was told that she was dehydrated and was encouraged to orally rehydrate, no medications were given at the time and she was not taken to the ER because she did not want to leave her kids.  She lives with her husband and her 26-year-old and 95-year-old.  She is currently not trying to get pregnant, she is currently not breast-feeding.  She limits caffeine to 2 cups of coffee.  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   She sleeps fairly well, no significant snoring reported.  She goes to bed around 10, rise time around 6.  They have a small dog in the household.  She drinks alcohol occasionally.  She is a non-smoker.  She is not aware of any family history of migraines.  She has taken over-the-counter Excedrin for her headaches but for her severe migraines which have been for 5 times this year, Excedrin was not effective enough.    Her Past Medical History Is Significant For: Past Medical History:  Diagnosis Date   Asthma    Dysmenorrhea    Low platelet count (HCC)    hx of thrombocytopenia in pregnancy   Ovarian cyst    PCOS (polycystic ovarian syndrome)     Her Past Surgical History Is Significant For: Past Surgical History:  Procedure Laterality Date   LAPAROSCOPIC APPENDECTOMY N/A 05/01/2021   Procedure: APPENDECTOMY LAPAROSCOPIC;  Surgeon: Gaynelle Adu, MD;  Location: [facility];  Service: General;  Laterality: N/A;    Her Family History Is Significant For: Family History  Problem Relation Age of Onset   Hypertension Father    Alcohol abuse Father    Hyperlipidemia Father    Osteoarthritis Father    Hypertension Paternal Grandmother    Diabetes Paternal Grandmother    Headache Neg Hx     Her Social History Is Significant For: Social History   Socioeconomic History   Marital status: Single    Spouse name: Not on file  Number of children: 1   Years of education: Not on file   Highest education level: Bachelor's degree (e.g., BA, AB, BS)  Occupational History   Not on file  Tobacco Use   Smoking status: Never   Smokeless tobacco: Never  Vaping Use   Vaping status: Never Used  Substance and Sexual Activity   Alcohol use: No   Drug use: No   Sexual activity: Yes    Partners: Male    Birth control/protection: None  Other Topics Concern   Not on file  Social History Narrative   Lives alone.  Is a Theatre stage manager at [employer].   Social Determinants of Health   Financial Resource  Strain: Not on file  Food Insecurity: Low Risk  (04/08/2023)   Received from [facility]   Hunger Vital Sign    Worried About Running Out of Food in the Last Year: Never true    Ran Out of Food in the Last Year: Never true  Transportation Needs: Not on file (04/08/2023)  Physical Activity: Not on file  Stress: Not on file  Social Connections: Unknown (01/31/2022)   [facility]   Social Network    Social Network: Not on file    Her Allergies Are:  No Known Allergies:   Her Current Medications Are:  Outpatient Encounter Medications as of 07/24/2023  Medication Sig   cetirizine (ZYRTEC) 10 MG tablet Take by mouth.   fluticasone (FLONASE) 50 MCG/ACT nasal spray Place 2 sprays into both nostrils daily.   Multiple Vitamin (MULTI-VITAMIN) tablet Take by mouth.   acetaminophen (TYLENOL) 500 MG tablet Take 1,000 mg by mouth every 6 (six) hours as needed for mild pain.   albuterol (PROVENTIL HFA) 108 (90 Base) MCG/ACT inhaler Inhale 2 puffs into the lungs every 6 (six) hours as needed for wheezing.   calcium carbonate (TUMS EX) 750 MG chewable tablet Chew 2 tablets by mouth daily as needed for heartburn.   ibuprofen (ADVIL) 200 MG tablet Take 400 mg by mouth every 6 (six) hours as needed for mild pain.   ondansetron (ZOFRAN) 4 MG tablet Take 1 tablet (4 mg total) by mouth every 8 (eight) hours as needed for nausea or vomiting.   oxyCODONE (OXY IR/ROXICODONE) 5 MG immediate release tablet Take 1 tablet (5 mg total) by mouth every 4 (four) hours as needed for severe pain or moderate pain.   Prenatal w/o A Vit-Fe Fum-FA (AZESCHEW PRENATAL/POSTNATAL PO) Take by mouth.   No facility-administered encounter medications on file as of 07/24/2023.  :   Review of Systems:  Out of a complete 14 point review of systems, all are reviewed and negative with the exception of these symptoms as listed below:  Review of Systems  Neurological:        Pt here for headaches Pt  states 1 headache in last month Pt states right eye becomes  blurry before she gets a headache     Objective:  Neurological Exam  Physical Exam Physical Examination:   Vitals:   07/24/23 0811  BP: 99/63  Pulse: 67    General Examination: The patient is a very pleasant 32 y.o. female in no acute distress. She appears well-developed and well-nourished and well groomed.   HEENT: Normocephalic, atraumatic, pupils are equal, round and reactive to light, no photophobia.  Funduscopic exam benign.  Extraocular tracking is good without limitation to gaze excursion or nystagmus noted. Hearing is grossly intact. Face is symmetric with normal facial animation  and normal facial, temperature and vibration sense. Speech is clear with no dysarthria noted. There is no hypophonia. There is no lip, neck/head, jaw or voice tremor. Neck is supple with full range of passive and active motion. There are no carotid bruits on auscultation. Oropharynx exam reveals: mild mouth dryness, good dental hygiene. Tongue protrudes centrally and palate elevates symmetrically.   Chest: Clear to auscultation without wheezing, rhonchi or crackles noted.  Heart: S1+S2+0, regular and normal without murmurs, rubs or gallops noted.   Abdomen: Soft, non-tender and non-distended.  Extremities: There is no pitting edema in the distal lower extremities bilaterally.   Skin: Warm and dry without trophic changes noted.   Musculoskeletal: exam reveals no obvious joint deformities.   Neurologically:  Mental status: The patient is awake, alert and oriented in all 4 spheres. Her immediate and remote memory, attention, language skills and fund of knowledge are appropriate. There is no evidence of aphasia, agnosia, apraxia or anomia. Speech is clear with normal prosody and enunciation. Thought process is linear. Mood is normal and affect is normal.  Cranial nerves II - XII are as described above under HEENT exam.  Motor exam: Normal  bulk, strength and tone is noted.  There is no drift, postural or action or intention tremor, no rebound.  No intention tremor.  Romberg is negative.   Reflexes 2+ throughout, toes are downgoing bilaterally. Fine motor skills and coordination: Intact finger taps, hand movements and rapid alternating patting in both upper extremities, normal foot taps bilaterally in the lower extremities.  Cerebellar testing: No dysmetria or intention tremor. There is no truncal or gait ataxia.  Normal heel-to-shin, normal finger-to-nose bilaterally. Sensory exam: intact to light touch in the upper and lower extremities.  Gait, station and balance: She stands easily. No veering to one side is noted. No leaning to one side is noted. Posture is age-appropriate and stance is narrow based. Gait shows normal stride length and normal pace. No problems turning are noted.  Normal tandem walk.  Assessment and Plan:  In summary, Kaitlyn Espinoza is a very pleasant 32 y.o.-year old female with an underlying medical history of asthma, low platelets, PCOS, abnormal uterine bleeding, low TSH, and overweight state, who presents for evaluation of her migraine headaches of several months duration.  Frequency thankfully not very high, migraine description not alarming for any complicated or atypical migraine.  She has a nonfocal neurological exam which is also reassuring and she had a recent updated eye exam.  She is advised to use a triptan for acute migraine management, she is not at a point where she should consider a preventative, the options for acute medications or quite fast.  I would start with a low-dose triptan, I suggest that we start her on rizatriptan 5 mg strength under the tongue melt tab.  We talked about the medication at length including usage and potential common and some rare side effects.  She was given verbal instructions as well as instructions in her after visit summary in MyChart.  I do not see a pressing reason to proceed  with an MRI of the brain or CT scan at this time.  Should she have any atypical presentation such as sudden onset of one-sided weakness or numbness or tingling or droopy face or slurring of speech or unrelenting severe migraine, she is strongly advised to call 911 or have someone take her to the ER.  We talked about triggers and alleviating factors.  She is reminded to stay well-hydrated and  well rested.  She is advised to follow-up routinely in this clinic in about 6 months to see one of our nurse practitioners.  Should she feel well in the next few months and find Maxalt effective, she can also follow-up through your office and request further refills on the Maxalt through you if you are comfortable.  I answered all her questions today and she was in agreement with our plan.  Thank you very much for allowing me to participate in the care of this nice patient. If I can be of any further assistance to you please do not hesitate to call me at [phone].  Sincerely,   Huston Foley, MD, PhD

## 2023-07-24 NOTE — Patient Instructions (Signed)
It was nice to meet you today.  I believe you have intermittent migraine headaches. Thankfully, the migraines have not been very frequent.  Your neurological exam is good. I don't see a pressing reason to pursue a brain scan but we can consider an MRI in the future.  As discussed, for as needed use for a migraine attack: take Maxalt orally disintegrating tab, 5 mg: take 1 pill early on when you suspect a migraine attack come on. You may take another pill after 2 hours, no more than 2 pills in 24 hours. Most people who take triptans do not have any serious side-effects. However, they can cause drowsiness (remember to not drive or use heavy machinery when drowsy), nausea, dizziness, dry mouth. Less common side effects include strange sensations, such as tightness in your chest or throat, tingling, flushing, and feelings of heaviness or pressure in areas such as the face, limbs, and chest. These in the chest can mimic heart related pain (angina) and may cause alarm, but usually these sensations are not harmful or a sign of a heart attack. However, if you develop intense chest pain or sensations of discomfort, you should stop taking your medication and consult with me or your PCP or go to the nearest urgent care facility or ER or call 911.  Please remember, common headache triggers are: sleep deprivation, dehydration, overheating, stress, hypoglycemia or skipping meals and blood sugar fluctuations, excessive pain medications or excessive alcohol use or caffeine withdrawal. Some people have food triggers such as aged cheese, orange juice or chocolate, especially dark chocolate, or MSG (monosodium glutamate). Try to avoid these headache triggers as much possible. It may be helpful to keep a headache diary to figure out what makes your headaches worse or brings them on and what alleviates them. Some people report headache onset after exercise but studies have shown that regular exercise may actually prevent headaches  from coming. If you have exercise-induced headaches, please make sure that you drink plenty of fluid before and after exercising and that you do not over do it and do not overheat.

## 2023-08-12 ENCOUNTER — Ambulatory Visit: Payer: No Typology Code available for payment source | Admitting: Neurology

## 2023-11-07 DIAGNOSIS — Z308 Encounter for other contraceptive management: Secondary | ICD-10-CM | POA: Diagnosis not present

## 2023-11-26 DIAGNOSIS — Z308 Encounter for other contraceptive management: Secondary | ICD-10-CM | POA: Diagnosis not present

## 2023-11-26 DIAGNOSIS — Z01818 Encounter for other preprocedural examination: Secondary | ICD-10-CM | POA: Diagnosis not present

## 2024-01-30 ENCOUNTER — Ambulatory Visit: Payer: No Typology Code available for payment source | Admitting: Adult Health

## 2024-05-04 DIAGNOSIS — H5213 Myopia, bilateral: Secondary | ICD-10-CM | POA: Diagnosis not present

## 2024-05-07 DIAGNOSIS — Z0000 Encounter for general adult medical examination without abnormal findings: Secondary | ICD-10-CM | POA: Diagnosis not present
